# Patient Record
Sex: Male | Born: 1990 | Race: Black or African American | Hispanic: No | Marital: Single | Smoking: Current some day smoker
Health system: Southern US, Community
[De-identification: ages and names within clinical notes are randomized; demographics above are authoritative.]

---
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
## 2011-03-15 LAB — GC/CHLAMYDIA PROBE AMP, GENITAL: GC Probe Amp, Genital: POSITIVE — AB

## 2012-06-29 ENCOUNTER — Emergency Department (HOSPITAL_COMMUNITY): Payer: Self-pay

## 2012-06-29 ENCOUNTER — Emergency Department (HOSPITAL_COMMUNITY)
Admission: EM | Admit: 2012-06-29 | Discharge: 2012-06-29 | Disposition: A | Discharge status: Home / Self Care | Payer: Self-pay | Attending: Emergency Medicine | Admitting: Emergency Medicine

## 2012-06-29 ENCOUNTER — Encounter (HOSPITAL_COMMUNITY): Payer: Self-pay | Admitting: Emergency Medicine

## 2012-06-29 DIAGNOSIS — M25569 Pain in unspecified knee: Secondary | ICD-10-CM | POA: Insufficient documentation

## 2012-06-29 DIAGNOSIS — S838X9A Sprain of other specified parts of unspecified knee, initial encounter: Secondary | ICD-10-CM | POA: Insufficient documentation

## 2012-06-29 DIAGNOSIS — S86919A Strain of unspecified muscle(s) and tendon(s) at lower leg level, unspecified leg, initial encounter: Secondary | ICD-10-CM

## 2012-06-29 DIAGNOSIS — S86819A Strain of other muscle(s) and tendon(s) at lower leg level, unspecified leg, initial encounter: Secondary | ICD-10-CM | POA: Insufficient documentation

## 2012-06-29 DIAGNOSIS — M25579 Pain in unspecified ankle and joints of unspecified foot: Secondary | ICD-10-CM | POA: Insufficient documentation

## 2012-06-29 MED ORDER — ACETAMINOPHEN 325 MG PO TABS
650.0000 mg | ORAL_TABLET | Freq: Once | ORAL | Status: AC
  Administered 2012-06-29: 650 mg via ORAL
  Filled 2012-06-29: qty 2

## 2012-06-29 MED ORDER — HYDROCODONE-ACETAMINOPHEN 5-325 MG PO TABS: 2.0000 | ORAL_TABLET | ORAL | Status: AC | PRN
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
## 2012-06-29 NOTE — Progress Notes (Signed)
Orthopedic Tech Progress Note Patient Details:  Jonathan Williamson [birth year] [id number]  Ortho Devices Type of Ortho Device: Crutches;Knee Immobilizer Ortho Device/Splint Location: (R) LE Ortho Device/Splint Interventions: Application   Jennye Moccasin 06/29/2012, 6:00 PM
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
## 2013-02-11 ENCOUNTER — Encounter (HOSPITAL_COMMUNITY): Payer: Self-pay | Admitting: Emergency Medicine

## 2013-02-11 ENCOUNTER — Emergency Department (HOSPITAL_COMMUNITY): Payer: Self-pay

## 2013-02-11 ENCOUNTER — Emergency Department (HOSPITAL_COMMUNITY)
Admission: EM | Admit: 2013-02-11 | Discharge: 2013-02-11 | Disposition: A | Discharge status: Home / Self Care | Payer: Self-pay | Attending: Emergency Medicine | Admitting: Emergency Medicine

## 2013-02-11 DIAGNOSIS — M25569 Pain in unspecified knee: Secondary | ICD-10-CM | POA: Insufficient documentation

## 2013-02-11 DIAGNOSIS — F17200 Nicotine dependence, unspecified, uncomplicated: Secondary | ICD-10-CM | POA: Insufficient documentation

## 2013-02-11 DIAGNOSIS — G8929 Other chronic pain: Secondary | ICD-10-CM | POA: Insufficient documentation

## 2013-02-11 MED ORDER — HYDROCODONE-ACETAMINOPHEN 5-325 MG PO TABS: 2.0000 | ORAL_TABLET | ORAL | Status: DC | PRN

## 2013-02-11 MED ORDER — HYDROCODONE-ACETAMINOPHEN 5-325 MG PO TABS
2.0000 | ORAL_TABLET | Freq: Once | ORAL | Status: AC
  Administered 2013-02-11: 2 via ORAL
  Filled 2013-02-11: qty 2

## 2013-02-11 NOTE — ED Notes (Signed)
Pt reports increased burning and weakness in r/knee. Pt c/o increased pain and decreased mobility

## 2013-02-11 NOTE — ED Notes (Signed)
Pt has a ride home.  
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
## 2013-02-13 NOTE — ED Provider Notes (Signed)
History/physical exam/procedure(s) were performed by non-physician practitioner and as supervising physician I was immediately available for consultation/collaboration. I have reviewed all notes and am in agreement with care and plan.   Hilario Quarry, MD 02/13/13 [phone]

## 2013-03-10 ENCOUNTER — Emergency Department (HOSPITAL_COMMUNITY)
Admission: EM | Admit: 2013-03-10 | Discharge: 2013-03-10 | Disposition: A | Discharge status: Home / Self Care | Payer: Self-pay | Attending: Emergency Medicine | Admitting: Emergency Medicine

## 2013-03-10 ENCOUNTER — Encounter (HOSPITAL_COMMUNITY): Payer: Self-pay | Admitting: Emergency Medicine

## 2013-03-10 DIAGNOSIS — S6990XA Unspecified injury of unspecified wrist, hand and finger(s), initial encounter: Secondary | ICD-10-CM | POA: Insufficient documentation

## 2013-03-10 DIAGNOSIS — F17200 Nicotine dependence, unspecified, uncomplicated: Secondary | ICD-10-CM | POA: Insufficient documentation

## 2013-03-10 DIAGNOSIS — S6980XA Other specified injuries of unspecified wrist, hand and finger(s), initial encounter: Secondary | ICD-10-CM | POA: Insufficient documentation

## 2013-03-10 DIAGNOSIS — Y939 Activity, unspecified: Secondary | ICD-10-CM | POA: Insufficient documentation

## 2013-03-10 DIAGNOSIS — L02511 Cutaneous abscess of right hand: Secondary | ICD-10-CM

## 2013-03-10 DIAGNOSIS — Y929 Unspecified place or not applicable: Secondary | ICD-10-CM | POA: Insufficient documentation

## 2013-03-10 DIAGNOSIS — X58XXXA Exposure to other specified factors, initial encounter: Secondary | ICD-10-CM | POA: Insufficient documentation

## 2013-03-10 MED ORDER — SULFAMETHOXAZOLE-TRIMETHOPRIM 800-160 MG PO TABS: 1.0000 | ORAL_TABLET | Freq: Two times a day (BID) | ORAL | Status: DC

## 2013-03-10 MED ORDER — CEPHALEXIN 500 MG PO CAPS
500.0000 mg | ORAL_CAPSULE | Freq: Once | ORAL | Status: AC
  Administered 2013-03-10: 500 mg via ORAL
  Filled 2013-03-10: qty 1

## 2013-03-10 MED ORDER — HYDROCODONE-ACETAMINOPHEN 5-325 MG PO TABS: 1.0000 | ORAL_TABLET | ORAL | Status: DC | PRN

## 2013-03-10 MED ORDER — HYDROCODONE-ACETAMINOPHEN 5-325 MG PO TABS
1.0000 | ORAL_TABLET | Freq: Once | ORAL | Status: DC
  Filled 2013-03-10 (×2): qty 1

## 2013-03-10 MED ORDER — IBUPROFEN 800 MG PO TABS
800.0000 mg | ORAL_TABLET | Freq: Once | ORAL | Status: AC
  Administered 2013-03-10: 800 mg via ORAL
  Filled 2013-03-10: qty 1

## 2013-03-10 MED ORDER — CEPHALEXIN 500 MG PO CAPS: 500.0000 mg | ORAL_CAPSULE | Freq: Four times a day (QID) | ORAL | Status: DC

## 2013-03-10 NOTE — ED Provider Notes (Signed)
History     CSN: [id number]  Arrival date & time 03/10/13  7846   First MD Initiated Contact with Patient 03/10/13 [phone]      Chief Complaint  Patient presents with  . Finger Injury    (Consider location/radiation/quality/duration/timing/severity/associated sxs/prior treatment) HPI  22 year old male presents for evaluations of finger pain.  Patient reports for the past 3-4 days he has had gradual onset of pain to his right index finger. Describe pain as a sharp and throbbing sensation, worsening with palpation, persistent, not improved with applying ice. He denies any specific injury.  No fever, chills, rash.  Does not know if he had chicken pox in the past.  Have not pulled any hang nail from the affected finger.    History reviewed. No pertinent past medical history.  History reviewed. No pertinent past surgical history.  No family history on file.  History  Substance Use Topics  . Smoking status: Current Every Day Smoker -- 0.25 packs/day    Types: Cigarettes  . Smokeless tobacco: Not on file  . Alcohol Use: Yes     Comment: occasion      Review of Systems  Constitutional: Negative for fever.  Skin: Negative for rash and wound.  Neurological: Negative for numbness.    Allergies  Review of patient's allergies indicates no known allergies.  Home Medications   Current Outpatient Rx  Name  Route  Sig  Dispense  Refill  . HYDROcodone-acetaminophen (NORCO/VICODIN) 5-325 MG per tablet   Oral   Take 2 tablets by mouth every 4 (four) hours as needed for pain.   6 tablet   0     There were no vitals taken for this visit.  Physical Exam  Nursing note and vitals reviewed. Constitutional: He appears well-developed and well-nourished. No distress.  HENT:  Head: Atraumatic.  Eyes: Conjunctivae are normal.  Neck: Neck supple.  Musculoskeletal: He exhibits tenderness (R index finger: moderate edema warmth and tenderness to pad of finger, ttp, no abscess or rash  noted.  no joint involvement.  No nail involvement).  Neurological: He is alert.  Skin: Skin is warm. No rash noted.    ED Course  Procedures (including critical care time)  8:10 AM Pt has R index finger tenderness at pad of finger.  It appears to be signs of early felon.  No evidence of paronychia.  No evidence of joint involvement. Not amendable for drainage now. My attending agrees.  I have consulted with hand specialist, Dr. Izora Ribas who agrees to f/u if worsen.  Keflex and vicodin provided.    Labs Reviewed - No data to display No results found.   1. Felon, right       MDM  BP 134/82  Pulse 83  Temp(Src) 97.9 F (36.6 C) (Oral)  Resp 18  SpO2 99%         Fayrene Helper, PA-C 03/10/13 0818

## 2013-03-10 NOTE — ED Provider Notes (Signed)
Medical screening examination/treatment/procedure(s) were conducted as a shared visit with non-physician practitioner(s) and myself.  I personally evaluated the patient during the encounter  Edema and ttp finger pad.  Do not think it is extensive enough at this time to require I&D.  Will start abx.  Warm compresses.  Discussed with hand surgery and will have him follow up closely.  Celene Kras, MD 03/10/13 [phone]

## 2013-03-10 NOTE — ED Notes (Signed)
Voiced understanding of instructions given 
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
## 2013-03-10 NOTE — ED Notes (Signed)
States that he has not injured his right index finger in any way. States that it is very painful to touch on the palmar surface of his finger. States that the pain radiates down into his hand. Patient's right hand is swollen but he denies pain with palpation of his hand.

## 2013-09-12 ENCOUNTER — Emergency Department (HOSPITAL_COMMUNITY)
Admission: EM | Admit: 2013-09-12 | Discharge: 2013-09-12 | Disposition: A | Discharge status: Home / Self Care | Payer: Self-pay | Attending: Emergency Medicine | Admitting: Emergency Medicine

## 2013-09-12 ENCOUNTER — Encounter (HOSPITAL_COMMUNITY): Payer: Self-pay | Admitting: *Deleted

## 2013-09-12 DIAGNOSIS — R51 Headache: Secondary | ICD-10-CM | POA: Insufficient documentation

## 2013-09-12 DIAGNOSIS — R197 Diarrhea, unspecified: Secondary | ICD-10-CM | POA: Insufficient documentation

## 2013-09-12 DIAGNOSIS — F17200 Nicotine dependence, unspecified, uncomplicated: Secondary | ICD-10-CM | POA: Insufficient documentation

## 2013-09-12 DIAGNOSIS — J069 Acute upper respiratory infection, unspecified: Secondary | ICD-10-CM | POA: Insufficient documentation

## 2013-09-12 DIAGNOSIS — A088 Other specified intestinal infections: Secondary | ICD-10-CM | POA: Insufficient documentation

## 2013-09-12 DIAGNOSIS — A084 Viral intestinal infection, unspecified: Secondary | ICD-10-CM

## 2013-09-12 DIAGNOSIS — IMO0001 Reserved for inherently not codable concepts without codable children: Secondary | ICD-10-CM | POA: Insufficient documentation

## 2013-09-12 DIAGNOSIS — R509 Fever, unspecified: Secondary | ICD-10-CM | POA: Insufficient documentation

## 2013-09-12 MED ORDER — PROMETHAZINE HCL 25 MG PO TABS: 25.0000 mg | ORAL_TABLET | Freq: Four times a day (QID) | ORAL | Status: DC | PRN
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
## 2013-09-12 NOTE — ED Notes (Signed)
Pt c/o cold symptoms; fever since Sunday

## 2013-09-16 NOTE — ED Provider Notes (Signed)
Medical screening examination/treatment/procedure(s) were performed by non-physician practitioner and as supervising physician I was immediately available for consultation/collaboration.  Nicloe Frontera R. Pookela Sellin, MD 09/16/13 1020 

## 2014-03-24 ENCOUNTER — Emergency Department (HOSPITAL_COMMUNITY)
Admission: EM | Admit: 2014-03-24 | Discharge: 2014-03-24 | Disposition: A | Discharge status: Home / Self Care | Payer: Self-pay | Attending: Emergency Medicine | Admitting: Emergency Medicine

## 2014-03-24 ENCOUNTER — Emergency Department (HOSPITAL_COMMUNITY): Payer: Self-pay

## 2014-03-24 ENCOUNTER — Encounter (HOSPITAL_COMMUNITY): Payer: Self-pay | Admitting: Emergency Medicine

## 2014-03-24 DIAGNOSIS — Y9289 Other specified places as the place of occurrence of the external cause: Secondary | ICD-10-CM | POA: Insufficient documentation

## 2014-03-24 DIAGNOSIS — S0990XA Unspecified injury of head, initial encounter: Secondary | ICD-10-CM | POA: Insufficient documentation

## 2014-03-24 DIAGNOSIS — Y9389 Activity, other specified: Secondary | ICD-10-CM | POA: Insufficient documentation

## 2014-03-24 DIAGNOSIS — IMO0002 Reserved for concepts with insufficient information to code with codable children: Secondary | ICD-10-CM | POA: Insufficient documentation

## 2014-03-24 DIAGNOSIS — S199XXA Unspecified injury of neck, initial encounter: Secondary | ICD-10-CM

## 2014-03-24 DIAGNOSIS — F411 Generalized anxiety disorder: Secondary | ICD-10-CM | POA: Insufficient documentation

## 2014-03-24 DIAGNOSIS — F17200 Nicotine dependence, unspecified, uncomplicated: Secondary | ICD-10-CM | POA: Insufficient documentation

## 2014-03-24 DIAGNOSIS — S0993XA Unspecified injury of face, initial encounter: Secondary | ICD-10-CM | POA: Insufficient documentation

## 2014-03-24 LAB — CBC
HEMATOCRIT: 39 % (ref 39.0–52.0)
HEMOGLOBIN: 14.2 g/dL (ref 13.0–17.0)
MCH: 31.8 pg (ref 26.0–34.0)
MCHC: 36.4 g/dL — AB (ref 30.0–36.0)
MCV: 87.4 fL (ref 78.0–100.0)
Platelets: 240 10*3/uL (ref 150–400)
RBC: 4.46 MIL/uL (ref 4.22–5.81)
RDW: 12.5 % (ref 11.5–15.5)
WBC: 4.3 10*3/uL (ref 4.0–10.5)

## 2014-03-24 LAB — COMPREHENSIVE METABOLIC PANEL
ALT: 18 U/L (ref 0–53)
AST: 26 U/L (ref 0–37)
Albumin: 3.7 g/dL (ref 3.5–5.2)
Alkaline Phosphatase: 44 U/L (ref 39–117)
BUN: 12 mg/dL (ref 6–23)
CALCIUM: 8.9 mg/dL (ref 8.4–10.5)
CO2: 23 mEq/L (ref 19–32)
CREATININE: 1.15 mg/dL (ref 0.50–1.35)
Chloride: 109 mEq/L (ref 96–112)
GFR calc non Af Amer: 89 mL/min — ABNORMAL LOW (ref >90)
GLUCOSE: 78 mg/dL (ref 70–99)
Potassium: 4.1 mEq/L (ref 3.7–5.3)
SODIUM: 144 meq/L (ref 137–147)
TOTAL PROTEIN: 6.8 g/dL (ref 6.0–8.3)
Total Bilirubin: 0.4 mg/dL (ref 0.3–1.2)

## 2014-03-24 LAB — PROTIME-INR
INR: 0.99 (ref 0.00–1.49)
PROTHROMBIN TIME: 12.9 s (ref 11.6–15.2)

## 2014-03-24 LAB — ETHANOL: ALCOHOL ETHYL (B): 57 mg/dL — AB (ref 0–11)

## 2014-03-24 MED ORDER — IBUPROFEN 800 MG PO TABS
800.0000 mg | ORAL_TABLET | Freq: Once | ORAL | Status: AC
  Administered 2014-03-24: 800 mg via ORAL
  Filled 2014-03-24: qty 1

## 2014-03-24 MED ORDER — KETOROLAC TROMETHAMINE 30 MG/ML IJ SOLN
30.0000 mg | Freq: Once | INTRAMUSCULAR | Status: AC
  Administered 2014-03-24: 30 mg via INTRAVENOUS
  Filled 2014-03-24: qty 1

## 2014-03-24 MED ORDER — SODIUM CHLORIDE 0.9 % IV BOLUS (SEPSIS)
1000.0000 mL | Freq: Once | INTRAVENOUS | Status: AC
  Administered 2014-03-24: 1000 mL via INTRAVENOUS

## 2014-03-24 MED ORDER — IBUPROFEN 800 MG PO TABS: 800.0000 mg | ORAL_TABLET | Freq: Three times a day (TID) | ORAL | Status: DC

## 2014-03-24 NOTE — ED Notes (Signed)
Patient transported to CT 
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
## 2014-03-24 NOTE — ED Notes (Signed)
Pt reports ETOH on board. Reports drinking 1 beer earlier in the day.
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
## 2015-04-09 ENCOUNTER — Other Ambulatory Visit: Payer: Self-pay

## 2015-04-09 ENCOUNTER — Emergency Department (HOSPITAL_BASED_OUTPATIENT_CLINIC_OR_DEPARTMENT_OTHER): Payer: Managed Care, Other (non HMO)

## 2015-04-09 ENCOUNTER — Emergency Department (HOSPITAL_COMMUNITY)
Admission: EM | Admit: 2015-04-09 | Discharge: 2015-04-09 | Disposition: A | Discharge status: Home / Self Care | Payer: Managed Care, Other (non HMO) | Attending: Emergency Medicine | Admitting: Emergency Medicine

## 2015-04-09 ENCOUNTER — Encounter (HOSPITAL_COMMUNITY): Payer: Self-pay | Admitting: *Deleted

## 2015-04-09 ENCOUNTER — Emergency Department (HOSPITAL_COMMUNITY): Payer: Managed Care, Other (non HMO)

## 2015-04-09 DIAGNOSIS — R0789 Other chest pain: Secondary | ICD-10-CM | POA: Diagnosis not present

## 2015-04-09 DIAGNOSIS — M79609 Pain in unspecified limb: Secondary | ICD-10-CM

## 2015-04-09 DIAGNOSIS — Z720 Tobacco use: Secondary | ICD-10-CM | POA: Diagnosis not present

## 2015-04-09 DIAGNOSIS — R0602 Shortness of breath: Secondary | ICD-10-CM | POA: Insufficient documentation

## 2015-04-09 DIAGNOSIS — Z791 Long term (current) use of non-steroidal anti-inflammatories (NSAID): Secondary | ICD-10-CM | POA: Insufficient documentation

## 2015-04-09 DIAGNOSIS — M7989 Other specified soft tissue disorders: Secondary | ICD-10-CM

## 2015-04-09 DIAGNOSIS — M79604 Pain in right leg: Secondary | ICD-10-CM

## 2015-04-09 DIAGNOSIS — F419 Anxiety disorder, unspecified: Secondary | ICD-10-CM | POA: Diagnosis not present

## 2015-04-09 LAB — CBC
HCT: 41.2 % (ref 39.0–52.0)
HEMOGLOBIN: 14.6 g/dL (ref 13.0–17.0)
MCH: 30.5 pg (ref 26.0–34.0)
MCHC: 35.4 g/dL (ref 30.0–36.0)
MCV: 86 fL (ref 78.0–100.0)
PLATELETS: 271 10*3/uL (ref 150–400)
RBC: 4.79 MIL/uL (ref 4.22–5.81)
RDW: 12.4 % (ref 11.5–15.5)
WBC: 4.1 10*3/uL (ref 4.0–10.5)

## 2015-04-09 LAB — BASIC METABOLIC PANEL
Anion gap: 8 (ref 5–15)
BUN: 10 mg/dL (ref 6–20)
CO2: 24 mmol/L (ref 22–32)
Calcium: 9.6 mg/dL (ref 8.9–10.3)
Chloride: 108 mmol/L (ref 101–111)
Creatinine, Ser: 0.96 mg/dL (ref 0.61–1.24)
GFR calc Af Amer: >60 mL/min (ref >60)
GLUCOSE: 99 mg/dL (ref 70–99)
Potassium: 4.2 mmol/L (ref 3.5–5.1)
Sodium: 140 mmol/L (ref 135–145)

## 2015-04-09 LAB — I-STAT TROPONIN, ED: TROPONIN I, POC: 0 ng/mL (ref 0.00–0.08)

## 2015-04-09 MED ORDER — NAPROXEN 500 MG PO TABS: 500.0000 mg | ORAL_TABLET | Freq: Two times a day (BID) | ORAL | Status: DC

## 2015-04-09 NOTE — ED Notes (Signed)
Josh, pa-c, at the bedside.  

## 2015-04-09 NOTE — ED Notes (Signed)
Called ultrasound to verify patient is on list for travel. Spoke with [place], patient is on the list for transport. Explained plan of care to patient.

## 2015-04-09 NOTE — Progress Notes (Signed)
VASCULAR LAB PRELIMINARY  PRELIMINARY  PRELIMINARY  PRELIMINARY  Right lower extremity venous duplex completed.    Preliminary report:  Right:  No evidence of DVT, superficial thrombosis, or Baker's cyst.  Jonathan Williamson, RVS 04/09/2015, 8:47 PM

## 2015-04-09 NOTE — ED Provider Notes (Signed)
CSN: [id number]     Arrival date & time 04/09/15  1549 History  This chart was scribed for non-physician practitioner Renne Crigler, PA-C working with Jerelyn Scott, MD by Murriel Hopper, ED Scribe. This patient was seen in room TR10C/TR10C and the patient's care was started at 6:17 PM.   Chief Complaint  Patient presents with  . Leg Pain  . Shortness of Breath    The history is provided by the patient. No language interpreter was used.     HPI Comments: Jonathan Williamson is a 24 y.o. male who presents to the Emergency Department complaining of intermittent SOB with associated chest tightness and right calf pain that has been present for about a week. Pt states that he began to have SOB and chest tightness last week when he was at work carrying and moving things. Pt notes that his chest tightness worsens with movement and states that it feels muscular in nature. Pt describes his right calf pain as a burning sensation and notes that it only occurs whenever he has SOB and chest tightness. Pt denies cough, coughing up blood, recent travel, recent surgeries/immobilization. No h/o DVT/PE.    History reviewed. No pertinent past medical history. History reviewed. No pertinent past surgical history. No family history on file. History  Substance Use Topics  . Smoking status: Current Every Day Smoker -- 0.25 packs/day    Types: Cigarettes  . Smokeless tobacco: Not on file  . Alcohol Use: Yes     Comment: occasion    Review of Systems  Constitutional: Negative for fever.  HENT: Negative for rhinorrhea and sore throat.   Eyes: Negative for redness.  Respiratory: Positive for chest tightness and shortness of breath. Negative for cough.   Cardiovascular: Negative for chest pain and leg swelling.  Gastrointestinal: Negative for nausea, vomiting, abdominal pain and diarrhea.  Genitourinary: Negative for dysuria.  Musculoskeletal: Positive for myalgias.  Skin: Negative for rash.  Neurological:  Negative for headaches.      Allergies  Review of patient's allergies indicates no known allergies.  Home Medications   Prior to Admission medications   Medication Sig Start Date End Date Taking? Authorizing Provider  ibuprofen (ADVIL,MOTRIN) 800 MG tablet Take 1 tablet (800 mg total) by mouth 3 (three) times daily. Patient not taking: Reported on 04/09/2015 03/24/14   Gerhard Munch, MD   BP 153/112 mmHg  Pulse 91  Temp(Src) 98.4 F (36.9 C) (Oral)  Resp 18  SpO2 100% Physical Exam  Constitutional: He is oriented to person, place, and time. He appears well-developed and well-nourished.  HENT:  Head: Normocephalic and atraumatic.  Mouth/Throat: Oropharynx is clear and moist.  Eyes: Conjunctivae are normal. Right eye exhibits no discharge. Left eye exhibits no discharge.  Neck: Normal range of motion. Neck supple.  Cardiovascular: Normal rate, regular rhythm and normal heart sounds.   No murmur heard. Pulses:      Dorsalis pedis pulses are 2+ on the right side, and 2+ on the left side.  Pulmonary/Chest: Effort normal and breath sounds normal. No respiratory distress. He has no wheezes. He has no rales. He exhibits tenderness (anterior chest tenderness to palpation).  Abdominal: Soft. He exhibits no distension. There is no tenderness.  Musculoskeletal: He exhibits tenderness. He exhibits no edema.  Minimal R calf tenderness. No appreciable swelling. No thigh tenderness. L LE nml.   Neurological: He is alert and oriented to person, place, and time.  Skin: Skin is warm and dry.  Psychiatric: His mood appears  anxious.  Nursing note and vitals reviewed.   ED Course  Procedures (including critical care time)  DIAGNOSTIC STUDIES: Oxygen Saturation is 100% on room air, normal by my interpretation.    COORDINATION OF CARE: 6:20 PM Discussed treatment plan with pt at bedside and pt agreed to plan.   Labs Review Labs Reviewed  CBC  BASIC METABOLIC PANEL  Rosezena SensorI-STAT TROPOININ, ED     Imaging Review Dg Chest 2 View  04/09/2015   CLINICAL DATA:  Leg pain.  Shortness of breath.  Calf pain.  EXAM: CHEST  2 VIEW  COMPARISON:  03/24/2014.  FINDINGS: Cardiopericardial silhouette within normal limits. Mediastinal contours normal. Trachea midline. No airspace disease or effusion.  IMPRESSION: No active cardiopulmonary disease.   Electronically Signed   By: Andreas NewportGeoffrey  Lamke M.D.   On: 04/09/2015 18:31     EKG Interpretation None      9:19 PM lower extremity Doppler performed and is negative for DVT. Reviewed all other results with patient and family at bedside.  Patient to use NSAIDs for lower extremity pain and chest wall tenderness. Patient encouraged to follow-up with PCP for further evaluation of symptoms if not improved in one week. Patient encouraged to return to the emergency department with worsening or changing chest pain, shortness of breath, difficulty breathing, or other concerns.  Patient was counseled to return with severe chest pain, especially if the pain is crushing or pressure-like and spreads to the arms, back, neck, or jaw, or if they have sweating, nausea, or shortness of breath with the pain. They were encouraged to call 911 with these symptoms.   They were also told to return if their chest pain gets worse and does not go away with rest, they have an attack of chest pain lasting longer than usual despite rest and treatment with the medications their caregiver has prescribed, if they wake from sleep with chest pain or shortness of breath, if they feel dizzy or faint, if they have chest pain not typical of their usual pain, or if they have any other emergent concerns regarding their health.  The patient verbalized understanding and agreed.    MDM   Final diagnoses:  Pain of right lower extremity  Musculoskeletal chest pain   Patient with right lower extremity burning pain as well as reproducible chest pain and subjective shortness of breath. Of note,  patient is in no respiratory distress at time of exam. Shortness of breath sensation seems to be most related to his chest wall pain. Cardiac workup in the ED including chest x-ray was negative. Doppler ultrasound was performed of right lower extremity and does not demonstrate any DVT. Right lower extremity does not demonstrate any cellulitis. Normal lower extremity pulses. I have very low suspicion for PE. Patient was tachycardic on initial EKG but otherwise has not been tachycardic since then, and has no other major risk factors for thromboembolism. No hypoxia.  Very low suspicion for ACS, pneumonia, other emergent causes of chest pain.   I personally performed the services described in this documentation, which was scribed in my presence. The recorded information has been reviewed and is accurate.    Renne CriglerJoshua Eadie Repetto, PA-C 04/09/15 2122  Jerelyn ScottMartha Linker, MD 04/09/15 2200

## 2015-04-09 NOTE — ED Notes (Signed)
Pt is here with right calf muscle burning for one week.  Pulse present.  PT states burning started in thighs and travelled to calf and is reporting sob over the last week.

## 2015-04-09 NOTE — Discharge Instructions (Signed)
Please read and follow all provided instructions.  Your diagnoses today include:  1. Pain of right lower extremity   2. Musculoskeletal chest pain    Tests performed today include:  An EKG of your heart  A chest x-ray  Cardiac enzymes - a blood test for heart muscle damage  Blood counts and electrolytes  Vital signs. See below for your results today.   Medications prescribed:   Naproxen - anti-inflammatory pain medication  Do not exceed 500mg  naproxen every 12 hours, take with food  You have been prescribed an anti-inflammatory medication or NSAID. Take with food. Take smallest effective dose for the shortest duration needed for your pain. Stop taking if you experience stomach pain or vomiting.   Take any prescribed medications only as directed.  Follow-up instructions: Please follow-up with your primary care provider as soon as you can for further evaluation of your symptoms.   Return instructions:  SEEK IMMEDIATE MEDICAL ATTENTION IF:  You have severe chest pain, especially if the pain is crushing or pressure-like and spreads to the arms, back, neck, or jaw, or if you have sweating, nausea (feeling sick to your stomach), or shortness of breath. THIS IS AN EMERGENCY. Don't wait to see if the pain will go away. Get medical help at once. Call 911 or 0 (operator). DO NOT drive yourself to the hospital.   Your chest pain gets worse and does not go away with rest.   You have an attack of chest pain lasting longer than usual, despite rest and treatment with the medications your caregiver has prescribed.   You wake from sleep with chest pain or shortness of breath.  You feel dizzy or faint.  You have chest pain not typical of your usual pain for which you originally saw your caregiver.   You have any other emergent concerns regarding your health.  Additional Information: Chest pain comes from many different causes. Your caregiver has diagnosed you as having chest pain that  is not specific for one problem, but does not require admission.  You are at low risk for an acute heart condition or other serious illness.   Your vital signs today were: BP 125/80 mmHg   Pulse 73   Temp(Src) 98.1 F (36.7 C) (Oral)   Resp 18   SpO2 100% If your blood pressure (BP) was elevated above 135/85 this visit, please have this repeated by your doctor within one month. --------------

## 2015-06-18 ENCOUNTER — Encounter (HOSPITAL_COMMUNITY): Payer: Self-pay

## 2015-06-18 ENCOUNTER — Emergency Department (HOSPITAL_COMMUNITY): Payer: Managed Care, Other (non HMO)

## 2015-06-18 ENCOUNTER — Emergency Department (HOSPITAL_COMMUNITY)
Admission: EM | Admit: 2015-06-18 | Discharge: 2015-06-18 | Disposition: A | Discharge status: Home / Self Care | Payer: Managed Care, Other (non HMO) | Attending: Emergency Medicine | Admitting: Emergency Medicine

## 2015-06-18 DIAGNOSIS — Z720 Tobacco use: Secondary | ICD-10-CM | POA: Insufficient documentation

## 2015-06-18 DIAGNOSIS — Z791 Long term (current) use of non-steroidal anti-inflammatories (NSAID): Secondary | ICD-10-CM | POA: Diagnosis not present

## 2015-06-18 DIAGNOSIS — M25552 Pain in left hip: Secondary | ICD-10-CM | POA: Insufficient documentation

## 2015-06-18 MED ORDER — TRAMADOL HCL 50 MG PO TABS: 50.0000 mg | ORAL_TABLET | Freq: Four times a day (QID) | ORAL | Status: DC | PRN

## 2015-06-18 NOTE — ED Notes (Signed)
Pt escorted to discharge window. Verbalized understanding discharge instructions. In no acute distress.   

## 2015-06-18 NOTE — ED Provider Notes (Signed)
CSN: [id number]     Arrival date & time 06/18/15  1134 History   First MD Initiated Contact with Patient 06/18/15 1151     Chief Complaint  Patient presents with  . Hip Pain     (Consider location/radiation/quality/duration/timing/severity/associated sxs/prior Treatment) HPI Comments: States that he does a lot of lifting at work and then also works out. No definite injury  Patient is a 24 y.o. male presenting with hip pain. The history is provided by the patient. No language interpreter was used.  Hip Pain This is a new problem. The current episode started 1 to 4 weeks ago. The problem occurs constantly. The problem has been gradually worsening. Pertinent negatives include no fever or joint swelling. The symptoms are aggravated by walking. He has tried acetaminophen and NSAIDs for the symptoms. The treatment provided mild relief.    History reviewed. No pertinent past medical history. History reviewed. No pertinent past surgical history. No family history on file. History  Substance Use Topics  . Smoking status: Current Every Day Smoker -- 0.25 packs/day    Types: Cigarettes  . Smokeless tobacco: Not on file  . Alcohol Use: Yes     Comment: occasion    Review of Systems  Constitutional: Negative for fever.  Musculoskeletal: Negative for joint swelling.  All other systems reviewed and are negative.     Allergies  Review of patient's allergies indicates no known allergies.  Home Medications   Prior to Admission medications   Medication Sig Start Date End Date Taking? Authorizing Provider  naproxen (NAPROSYN) 500 MG tablet Take 1 tablet (500 mg total) by mouth 2 (two) times daily with a meal. 04/09/15   Renne CriglerJoshua Geiple, PA-C   BP 164/94 mmHg  Pulse 84  Temp(Src) 98.1 F (36.7 C) (Oral)  Resp 16  SpO2 100% Physical Exam  Constitutional: He is oriented to person, place, and time.  Cardiovascular: Normal rate and regular rhythm.   Pulmonary/Chest: Effort normal and breath  sounds normal.  Musculoskeletal: Normal range of motion.  Tender on the lateral left hip. No shortening or rotation. No redness or warmth noted  Neurological: He is alert and oriented to person, place, and time. Coordination normal.  Skin: Skin is warm.  Nursing note and vitals reviewed.   ED Course  Procedures (including critical care time) Labs Review Labs Reviewed - No data to display  Imaging Review Dg Hip Unilat With Pelvis 2-3 Views Left  06/18/2015   CLINICAL DATA:  Left hip pain for 3 weeks, no known injury, initial encounter  EXAM: DG HIP (WITH OR WITHOUT PELVIS) 2-3V LEFT  COMPARISON:  None.  FINDINGS: There is no evidence of hip fracture or dislocation. There is no evidence of arthropathy or other focal bone abnormality.  IMPRESSION: No acute abnormality noted.   Electronically Signed   By: Alcide CleverMark  Lukens M.D.   On: 06/18/2015 12:59     EKG Interpretation None      MDM   Final diagnoses:  Hip pain, left    Pt is neurologically intact. No acute bony injury noted. Pt is okay to follow up with ortho as needed. Pt given ultram for pain    Teressa LowerVrinda Berdell Nevitt, NP 06/18/15 1308  Raeford RazorStephen Kohut, MD 06/20/15 0930

## 2015-06-18 NOTE — Discharge Instructions (Signed)

## 2015-06-18 NOTE — ED Notes (Signed)
Pt presents with c/o left hip pain for approx 3 weeks. Pt is unsure of whether he injured the hip. Pt ambulatory to triage.

## 2015-06-18 NOTE — ED Notes (Signed)
Pt given a work note.  

## 2015-08-21 ENCOUNTER — Emergency Department (HOSPITAL_COMMUNITY)
Admission: EM | Admit: 2015-08-21 | Discharge: 2015-08-21 | Disposition: A | Discharge status: Home / Self Care | Payer: Managed Care, Other (non HMO) | Attending: Emergency Medicine | Admitting: Emergency Medicine

## 2015-08-21 ENCOUNTER — Encounter (HOSPITAL_COMMUNITY): Payer: Self-pay | Admitting: Emergency Medicine

## 2015-08-21 DIAGNOSIS — Z720 Tobacco use: Secondary | ICD-10-CM | POA: Insufficient documentation

## 2015-08-21 DIAGNOSIS — R1030 Lower abdominal pain, unspecified: Secondary | ICD-10-CM | POA: Diagnosis present

## 2015-08-21 DIAGNOSIS — B356 Tinea cruris: Secondary | ICD-10-CM

## 2015-08-21 MED ORDER — CLOTRIMAZOLE 1 % EX CREA: TOPICAL_CREAM | CUTANEOUS | Status: DC

## 2015-08-21 NOTE — ED Notes (Signed)
Awake. Verbally responsive. A/O x4. Resp even and unlabored. No audible adventitious breath sounds noted. ABC's intact.  

## 2015-08-21 NOTE — Discharge Instructions (Signed)
Jock Itch Jock itch is a fungal infection of the skin in the groin area. It is sometimes called "ringworm" even though it is not caused by a worm. A fungus is a type of germ that thrives in dark, damp places.  CAUSES  This infection may spread from:  A fungus infection elsewhere on the body (such as athlete's foot).  Sharing towels or clothing. This infection is more common in:  Hot, humid climates.  People who wear tight-fitting clothing or wet bathing suits for long periods of time.  Athletes.  Overweight people.  People with diabetes. SYMPTOMS  Jock itch causes the following symptoms:  Red, pink or brown rash in the groin. Rash may spread to the thighs, anus, and buttocks.  Itching. DIAGNOSIS  Your caregiver may make the diagnosis by looking at the rash. Sometimes a skin scraping will be sent to test for fungus. Testing can be done either by looking under the microscope or by doing a culture (test to try to grow the fungus). A culture can take up to 2 weeks to come back. TREATMENT  Jock itch may be treated with:  Skin cream or ointment to kill fungus.  Medicine by mouth to kill fungus.  Skin cream or ointment to calm the itching.  Compresses or medicated powders to dry the infected skin. HOME CARE INSTRUCTIONS   Be sure to treat the rash completely. Follow your caregiver's instructions. It can take a couple of weeks to treat. If you do not treat the infection long enough, the rash can come back.  Wear loose-fitting clothing.  Men should wear cotton boxer shorts.  Women should wear cotton underwear.  Avoid hot baths.  Dry the groin area well after bathing. SEEK MEDICAL CARE IF:   Your rash is worse.  Your rash is spreading.  Your rash returns after treatment is finished.  Your rash is not gone in 4 weeks. Fungal infections are slow to respond to treatment. Some redness may remain for several weeks after the fungus is gone. SEEK IMMEDIATE MEDICAL CARE  IF:  The area becomes red, warm, tender, and swollen.  You have a fever. Document Released: 11/14/2002 Document Revised: 02/16/2012 Document Reviewed: 10/13/2008 Exit[employer] Patient Information 2015 [employer]. This information is not intended to replace advice given to you by your health care provider. Make sure you discuss any questions you have with your health care provider.  

## 2015-08-21 NOTE — ED Provider Notes (Signed)
History  This chart was scribed for non-physician practitioner, Will Marijo File, PA-C,working with Eber Hong, MD, by Karle Plumber, ED Scribe. This patient was seen in room WTR9/WTR9 and the patient's care was started at 3:14 PM.  Chief Complaint  Patient presents with  . Groin Pain   The history is provided by the patient and medical records. No language interpreter was used.    HPI Comments:  Jonathan Williamson is a 24 y.o. male who presents to the Emergency Department complaining of itching on bilateral inner thighs for the past week. He states there is no rash but the area appears white. He states he has been applying GoldBond with no relief of the symptoms. He denies modifying factors. He denies nausea, vomiting, fever, chills, dysuria, testicle pain, scrotal pain, testicle swelling, penile swelling, penile discharge, abdominal pain, bowel or bladder changes, rash or abdominal pain.  History reviewed. No pertinent past medical history. History reviewed. No pertinent past surgical history. No family history on file. Social History  Substance Use Topics  . Smoking status: Current Every Day Smoker -- 0.25 packs/day    Types: Cigarettes  . Smokeless tobacco: None  . Alcohol Use: Yes     Comment: occasion    Review of Systems  Constitutional: Negative for fever and chills.  Gastrointestinal: Negative for nausea, vomiting and abdominal pain.  Genitourinary: Negative for dysuria, urgency, frequency, hematuria, discharge, penile swelling, scrotal swelling, difficulty urinating, genital sores, penile pain and testicular pain.       Groin itching  Skin: Negative for color change and wound.    Allergies  Review of patient's allergies indicates no known allergies.  Home Medications   Prior to Admission medications   Medication Sig Start Date End Date Taking? Authorizing Provider  Aspirin-Acetaminophen-Caffeine (GOODY HEADACHE PO) Take 1 each by mouth every 12 (twelve) hours as needed  (pain).   Yes Historical Provider, MD  clotrimazole (LOTRIMIN) 1 % cream Apply to affected area 2 times daily 08/21/15   Everlene Farrier, PA-C  naproxen (NAPROSYN) 500 MG tablet Take 1 tablet (500 mg total) by mouth 2 (two) times daily with a meal. Patient not taking: Reported on 08/21/2015 04/09/15   Renne Crigler, PA-C  traMADol (ULTRAM) 50 MG tablet Take 1 tablet (50 mg total) by mouth every 6 (six) hours as needed. Patient not taking: Reported on 08/21/2015 06/18/15   Teressa Lower, NP   Triage Vitals: BP 146/81 mmHg  Pulse 105  Temp(Src) 98.2 F (36.8 C) (Oral)  Resp 18  SpO2 100% Physical Exam  Constitutional: He appears well-developed and well-nourished. No distress.  HENT:  Head: Normocephalic and atraumatic.  Eyes: Right eye exhibits no discharge. Left eye exhibits no discharge.  Pulmonary/Chest: Effort normal. No respiratory distress.  Abdominal: Soft. There is no tenderness.  Genitourinary: Testes normal and penis normal. Right testis shows no mass, no swelling and no tenderness. Left testis shows no mass, no swelling and no tenderness. No penile tenderness.  Circular, erythematous patches with a distinct border to bilateral groin. Non-tender.  No vesicular lesions. No penile tenderness or discharge. No scrotal tenderness. No scrotal swelling.  Neurological: He is alert. Coordination normal.  Skin: Skin is warm and dry. He is not diaphoretic. There is erythema. No pallor.  Circular, erythematous patches with a distinct border to bilateral groin.  Psychiatric: He has a normal mood and affect. His behavior is normal.  Nursing note and vitals reviewed.   ED Course  Procedures (including critical care time) DIAGNOSTIC STUDIES: Oxygen  Saturation is 100% on RA, normal by my interpretation.   COORDINATION OF CARE: 3:19 PM- Will prescribe Lotrimin cream. Pt verbalizes understanding and agrees to plan.  Medications - No data to display   MDM   Meds given in ED:  Medications  - No data to display  New Prescriptions   CLOTRIMAZOLE (LOTRIMIN) 1 % CREAM    Apply to affected area 2 times daily    Final diagnoses:  Tinea cruris   Patient with tinea cruris to his bilateral inner groin. Will discharge with clotrimazole1% cream. Education on treatment of jock itch. I advised the patient to follow-up with their primary care provider this week. I advised the patient to return to the emergency department with new or worsening symptoms or new concerns. The patient verbalized understanding and agreement with plan.    I personally performed the services described in this documentation, which was scribed in my presence. The recorded information has been reviewed and is accurate.      Everlene Farrier, PA-C 08/21/15 1524  Eber Hong, MD 08/23/15 2159

## 2015-08-21 NOTE — ED Notes (Signed)
Pt reported itching in groin area. Pt denies penile discharge and burning.

## 2015-08-21 NOTE — ED Notes (Signed)
Pt c/o left groin "itching" x5 days. Denies urinary symptoms. Unrelieved by goldbond.

## 2015-08-28 ENCOUNTER — Emergency Department (HOSPITAL_COMMUNITY)
Admission: EM | Admit: 2015-08-28 | Discharge: 2015-08-28 | Disposition: A | Discharge status: Home / Self Care | Payer: Managed Care, Other (non HMO) | Attending: Emergency Medicine | Admitting: Emergency Medicine

## 2015-08-28 ENCOUNTER — Encounter (HOSPITAL_COMMUNITY): Payer: Self-pay

## 2015-08-28 ENCOUNTER — Emergency Department (HOSPITAL_COMMUNITY): Payer: Managed Care, Other (non HMO)

## 2015-08-28 DIAGNOSIS — Z566 Other physical and mental strain related to work: Secondary | ICD-10-CM | POA: Diagnosis not present

## 2015-08-28 DIAGNOSIS — R0789 Other chest pain: Secondary | ICD-10-CM | POA: Diagnosis not present

## 2015-08-28 DIAGNOSIS — Z720 Tobacco use: Secondary | ICD-10-CM | POA: Insufficient documentation

## 2015-08-28 DIAGNOSIS — R079 Chest pain, unspecified: Secondary | ICD-10-CM | POA: Diagnosis present

## 2015-08-28 DIAGNOSIS — F419 Anxiety disorder, unspecified: Secondary | ICD-10-CM | POA: Diagnosis not present

## 2015-08-28 DIAGNOSIS — R630 Anorexia: Secondary | ICD-10-CM | POA: Diagnosis not present

## 2015-08-28 DIAGNOSIS — R0602 Shortness of breath: Secondary | ICD-10-CM | POA: Diagnosis not present

## 2015-08-28 DIAGNOSIS — R350 Frequency of micturition: Secondary | ICD-10-CM | POA: Insufficient documentation

## 2015-08-28 LAB — CBC
HEMATOCRIT: 43.4 % (ref 39.0–52.0)
Hemoglobin: 15.8 g/dL (ref 13.0–17.0)
MCH: 31.8 pg (ref 26.0–34.0)
MCHC: 36.4 g/dL — ABNORMAL HIGH (ref 30.0–36.0)
MCV: 87.3 fL (ref 78.0–100.0)
PLATELETS: 267 10*3/uL (ref 150–400)
RBC: 4.97 MIL/uL (ref 4.22–5.81)
RDW: 12.7 % (ref 11.5–15.5)
WBC: 4.2 10*3/uL (ref 4.0–10.5)

## 2015-08-28 LAB — URINALYSIS, ROUTINE W REFLEX MICROSCOPIC
Bilirubin Urine: NEGATIVE
Glucose, UA: NEGATIVE mg/dL
Hgb urine dipstick: NEGATIVE
Ketones, ur: NEGATIVE mg/dL
Leukocytes, UA: NEGATIVE
Nitrite: NEGATIVE
Protein, ur: NEGATIVE mg/dL
Specific Gravity, Urine: 1.018 (ref 1.005–1.030)
Urobilinogen, UA: 1 mg/dL (ref 0.0–1.0)
pH: 6.5 (ref 5.0–8.0)

## 2015-08-28 LAB — I-STAT TROPONIN, ED: TROPONIN I, POC: 0.01 ng/mL (ref 0.00–0.08)

## 2015-08-28 LAB — BASIC METABOLIC PANEL
Anion gap: 9 (ref 5–15)
BUN: 10 mg/dL (ref 6–20)
CHLORIDE: 104 mmol/L (ref 101–111)
CO2: 25 mmol/L (ref 22–32)
CREATININE: 1.08 mg/dL (ref 0.61–1.24)
Calcium: 9.9 mg/dL (ref 8.9–10.3)
GFR calc Af Amer: >60 mL/min (ref >60)
GFR calc non Af Amer: >60 mL/min (ref >60)
Glucose, Bld: 108 mg/dL — ABNORMAL HIGH (ref 65–99)
POTASSIUM: 4.4 mmol/L (ref 3.5–5.1)
Sodium: 138 mmol/L (ref 135–145)

## 2015-08-28 MED ORDER — LORAZEPAM 1 MG PO TABS
1.0000 mg | ORAL_TABLET | Freq: Once | ORAL | Status: AC
  Administered 2015-08-28: 1 mg via ORAL
  Filled 2015-08-28: qty 1

## 2015-08-28 NOTE — Progress Notes (Signed)
[facility] ED CM noted pt with coverage but no pcp listed [facility] ED CM spoke with pt on how to obtain an in network pcp with insurance coverage via the customer service number or web site  Cm reviewed ED level of care for crisis/emergent services and community pcp level of care to manage continuous or chronic medical concerns.  The pt voiced understanding CM encouraged pt and discussed pt's responsibility to verify with pt's insurance carrier that any recommended medical provider offered by any emergency room or a hospital provider is within the carrier's network. The pt voiced understanding   

## 2015-08-28 NOTE — ED Notes (Addendum)
Pt c/o intermittent central chest pain, SOB w/ pain, lack of appetite, and urinary frequency x 4 days.  Pain score 5/10.  Pt reports chest hurts w/ "turning my head and when I work."  Denies injury.  Sts he woke up w/ pain.

## 2015-08-28 NOTE — ED Provider Notes (Signed)
CSN: [id number]     Arrival date & time 08/28/15  1105 History   First MD Initiated Contact with Patient 08/28/15 1503     Chief Complaint  Patient presents with  . Chest Pain  . Urinary Frequency     (Consider location/radiation/quality/duration/timing/severity/associated sxs/prior Treatment) HPI Comments: 24 year old male with no significant past medical history presenting with intermittent central chest pain 4 days. 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 The pain is not as bad at home as it is at work. Admits to associated shortness of breath when the pain begins. Has not tried any alleviating factors. No fever, chills, wheezing, cough, nausea, vomiting or abdominal pain. Also reporting urinary frequency over the past 4 days. Denies dysuria, hematuria, urgency, dry mouth, polydipsia, polyphagia, penile pain, swelling or discharge.  Patient is a 24 y.o. male presenting with chest pain and frequency. The history is provided by the patient.  Chest Pain Chest pain location: midsternal. Pain quality: aching and sharp   Pain radiates to:  Does not radiate Pain radiates to the back: no   Pain severity:  Moderate Duration:  4 days Timing:  Intermittent Progression:  Unchanged Chronicity:  New Context: lifting   Relieved by:  None tried Exacerbated by: lifting. Ineffective treatments:  None tried Associated symptoms: shortness of breath   Associated symptoms: no abdominal pain, no fever, no nausea and not vomiting   Urinary Frequency Associated symptoms include chest pain. Pertinent negatives include no abdominal pain, chills, fever, nausea or vomiting.    History reviewed. No pertinent past medical history. History reviewed. No pertinent past surgical history. History reviewed. No pertinent family history. Social History  Substance Use Topics  .  Smoking status: Current Every Day Smoker  . Smokeless tobacco: None     Comment: pt vvapes  . Alcohol Use: Yes     Comment: occasion    Review of Systems  Constitutional: Positive for appetite change. Negative for fever and chills.  Respiratory: Positive for shortness of breath.   Cardiovascular: Positive for chest pain.  Gastrointestinal: Negative for nausea, vomiting and abdominal pain.  Genitourinary: Positive for frequency. Negative for dysuria, urgency, discharge, penile swelling and penile pain.  Psychiatric/Behavioral: The patient is nervous/anxious.        + Stress.  All other systems reviewed and are negative.     Allergies  Review of patient's allergies indicates no known allergies.  Home Medications   Prior to Admission medications   Medication Sig Start Date End Date Taking? Authorizing Provider  Aspirin-Acetaminophen-Caffeine (GOODY HEADACHE PO) Take 1-2 each by mouth every 8 (eight) hours as needed (for pain).    Yes Historical Provider, MD  ibuprofen (ADVIL,MOTRIN) 200 MG tablet Take 400 mg by mouth every 6 (six) hours as needed for fever, headache, mild pain, moderate pain or cramping.   Yes Historical Provider, MD  clotrimazole (LOTRIMIN) 1 % cream Apply to affected area 2 times daily Patient not taking: Reported on 08/28/2015 08/21/15   Everlene Farrier, PA-C  naproxen (NAPROSYN) 500 MG tablet Take 1 tablet (500 mg total) by mouth 2 (two) times daily with a meal. Patient not taking: Reported on 08/21/2015 04/09/15   Renne Crigler, PA-C  traMADol (ULTRAM) 50 MG tablet Take 1 tablet (50 mg total) by mouth every 6 (six) hours as needed. Patient not taking: Reported on 08/21/2015 06/18/15  Teressa Lower, NP   BP 131/88 mmHg  Pulse 93  Temp(Src) 98 F (36.7 C) (Oral)  Resp 15  Ht  (1.753 m)  Wt 165 lb (74.844 kg)  BMI 24.36 kg/m2  SpO2 97% Physical Exam  Constitutional: He is oriented to person, place, and time. He appears well-developed and well-nourished.  No distress.  HENT:  Head: Normocephalic and atraumatic.  Mouth/Throat: Oropharynx is clear and moist.  Eyes: Conjunctivae and EOM are normal. Pupils are equal, round, and reactive to light.  Neck: Normal range of motion. Neck supple. No JVD present.  Cardiovascular: Normal rate, regular rhythm, normal heart sounds and intact distal pulses.   No extremity edema.  Pulmonary/Chest: Effort normal and breath sounds normal. No respiratory distress. He exhibits no tenderness.  Abdominal: Soft. Bowel sounds are normal. There is no tenderness.  Genitourinary:  Deferred per pt.  Musculoskeletal: Normal range of motion. He exhibits no edema.  Neurological: He is alert and oriented to person, place, and time. He has normal strength. No sensory deficit.  Speech fluent, goal oriented. Moves extremities without ataxia. Equal grip strength bilateral.  Skin: Skin is warm and dry. He is not diaphoretic.  Psychiatric: He has a normal mood and affect. His behavior is normal.  Nursing note and vitals reviewed.   ED Course  Procedures (including critical care time) Labs Review Labs Reviewed  BASIC METABOLIC PANEL - Abnormal; Notable for the following:    Glucose, Bld 108 (*)    All other components within normal limits  CBC - Abnormal; Notable for the following:    MCHC 36.4 (*)    All other components within normal limits  URINE CULTURE  URINALYSIS, ROUTINE W REFLEX MICROSCOPIC (NOT AT [facility])  I-STAT TROPOININ, ED  GC/CHLAMYDIA PROBE AMP ([place]) NOT AT [facility]    Imaging Review Dg Chest 2 View  08/28/2015   CLINICAL DATA:  Mid chest pain for 4 days.  Shortness of breath.  EXAM: CHEST  2 VIEW  COMPARISON:  04/09/2015  FINDINGS: The heart size and mediastinal contours are within normal limits. Both lungs are clear. The visualized skeletal structures are unremarkable.  IMPRESSION: No active cardiopulmonary disease.   Electronically Signed   By: Charlett Nose M.D.   On: 08/28/2015 13:01   I have  personally reviewed and evaluated these images and lab results as part of my medical decision-making.   EKG Interpretation None      MDM   Final diagnoses:  Midsternal chest pain  Stress at work  Urinary frequency   Non-toxic appearing, NAD. AFVSS. Labs without acute finding. UA negative. Culture pending. Urine GC/chlamydia culture pending. Patient refuses GU exam. No explanation for urinary frequency. Blood glucose 108. Chest x-ray negative. Patient is under a lot of stress, stating mostly at work. He was given some relief after receiving Ativan. Doubt cardiac. HEART score 2. Doubt PE. PERC negative. Advised the patient to stop smoking. Resources given for PCP follow-up. Discussed stress management. Stable for discharge. Return precautions given. Patient states understanding of treatment care plan and is agreeable.  Kathrynn Speed, PA-C 08/28/15 1635  Richardean Canal, MD 08/28/15 [phone]

## 2015-08-28 NOTE — Discharge Instructions (Signed)
Chest Pain (Nonspecific) It is often hard to give a specific diagnosis for the cause of chest pain. There is always a chance that your pain could be related to something serious, such as a heart attack or a blood clot in the lungs. You need to follow up with your health care provider for further evaluation. CAUSES   Heartburn.  Pneumonia or bronchitis.  Anxiety or stress.  Inflammation around your heart (pericarditis) or lung (pleuritis or pleurisy).  A blood clot in the lung.  A collapsed lung (pneumothorax). It can develop suddenly on its own (spontaneous pneumothorax) or from trauma to the chest.  Shingles infection (herpes zoster virus). The chest wall is composed of bones, muscles, and cartilage. Any of these can be the source of the pain.  The bones can be bruised by injury.  The muscles or cartilage can be strained by coughing or overwork.  The cartilage can be affected by inflammation and become sore (costochondritis). DIAGNOSIS  Lab tests or other studies may be needed to find the cause of your pain. Your health care provider may have you take a test called an ambulatory electrocardiogram (ECG). An ECG records your heartbeat patterns over a 24-hour period. You may also have other tests, such as:  Transthoracic echocardiogram (TTE). During echocardiography, sound waves are used to evaluate how blood flows through your heart.  Transesophageal echocardiogram (TEE).  Cardiac monitoring. This allows your health care provider to monitor your heart rate and rhythm in real time.  Holter monitor. This is a portable device that records your heartbeat and can help diagnose heart arrhythmias. It allows your health care provider to track your heart activity for several days, if needed.  Stress tests by exercise or by giving medicine that makes the heart beat faster. TREATMENT   Treatment depends on what may be causing your chest pain. Treatment may include:  Acid blockers for  heartburn.  Anti-inflammatory medicine.  Pain medicine for inflammatory conditions.  Antibiotics if an infection is present.  You may be advised to change lifestyle habits. This includes stopping smoking and avoiding alcohol, caffeine, and chocolate.  You may be advised to keep your head raised (elevated) when sleeping. This reduces the chance of acid going backward from your stomach into your esophagus. Most of the time, nonspecific chest pain will improve within 2-3 days with rest and mild pain medicine.  HOME CARE INSTRUCTIONS   If antibiotics were prescribed, take them as directed. Finish them even if you start to feel better.  For the next few days, avoid physical activities that bring on chest pain. Continue physical activities as directed.  Do not use any tobacco products, including cigarettes, chewing tobacco, or electronic cigarettes.  Avoid drinking alcohol.  Only take medicine as directed by your health care provider.  Follow your health care provider's suggestions for further testing if your chest pain does not go away.  Keep any follow-up appointments you made. If you do not go to an appointment, you could develop lasting (chronic) problems with pain. If there is any problem keeping an appointment, call to reschedule. SEEK MEDICAL CARE IF:   Your chest pain does not go away, even after treatment.  You have a rash with blisters on your chest.  You have a fever. SEEK IMMEDIATE MEDICAL CARE IF:   You have increased chest pain or pain that spreads to your arm, neck, jaw, back, or abdomen.  You have shortness of breath.  You have an increasing cough, or you cough  up blood.  You have severe back or abdominal pain.  You feel nauseous or vomit.  You have severe weakness.  You faint.  You have chills. This is an emergency. Do not wait to see if the pain will go away. Get medical help at once. Call your local emergency services (911 in [place] Do not drive  yourself to the hospital. MAKE SURE YOU:   Understand these instructions.  Will watch your condition.  Will get help right away if you are not doing well or get worse. Document Released: 09/03/2005 Document Revised: 11/29/2013 Document Reviewed: 06/29/2008 [facility] Patient Information 2015 [place], [place]. This information is not intended to replace advice given to you by your health care provider. Make sure you discuss any questions you have with your health care provider.  Stress and Stress Management Stress is a normal reaction to life events. It is what you feel when life demands more than you are used to or more than you can handle. Some stress can be useful. For example, the stress reaction can help you catch the last bus of the day, study for a test, or meet a deadline at work. But stress that occurs too often or for too long can cause problems. It can affect your emotional health and interfere with relationships and normal daily activities. Too much stress can weaken your immune system and increase your risk for physical illness. If you already have a medical problem, stress can make it worse. CAUSES  All sorts of life events may cause stress. An event that causes stress for one person may not be stressful for another person. Major life events commonly cause stress. These may be positive or negative. Examples include losing your job, moving into a new home, getting married, having a baby, or losing a loved one. Less obvious life events may also cause stress, especially if they occur day after day or in combination. Examples include working long hours, driving in traffic, caring for children, being in debt, or being in a difficult relationship. SIGNS AND SYMPTOMS Stress may cause emotional symptoms including, the following:  Anxiety. This is feeling worried, afraid, on edge, overwhelmed, or out of control.  Anger. This is feeling irritated or impatient.  Depression. This is feeling sad,  down, helpless, or guilty.  Difficulty focusing, remembering, or making decisions. Stress may cause physical symptoms, including the following:   Aches and pains. These may affect your head, neck, back, stomach, or other areas of your body.  Tight muscles or clenched jaw.  Low energy or trouble sleeping. Stress may cause unhealthy behaviors, including the following:   Eating to feel better (overeating) or skipping meals.  Sleeping too little, too much, or both.  Working too much or putting off tasks (procrastination).  Smoking, drinking alcohol, or using drugs to feel better. DIAGNOSIS  Stress is diagnosed through an assessment by your health care provider. Your health care provider will ask questions about your symptoms and any stressful life events.Your health care provider will also ask about your medical history and may order blood tests or other tests. Certain medical conditions and medicine can cause physical symptoms similar to stress. Mental illness can cause emotional symptoms and unhealthy behaviors similar to stress. Your health care provider may refer you to a mental health professional for further evaluation.  TREATMENT  Stress management is the recommended treatment for stress.The goals of stress management are reducing stressful life events and coping with stress in healthy ways.  Techniques for reducing stressful  life events include the following:  Stress identification. Self-monitor for stress and identify what causes stress for you. These skills may help you to avoid some stressful events.  Time management. Set your priorities, keep a calendar of events, and learn to say "no." These tools can help you avoid making too many commitments. Techniques for coping with stress include the following:  Rethinking the problem. Try to think realistically about stressful events rather than ignoring them or overreacting. Try to find the positives in a stressful situation rather  than focusing on the negatives.  Exercise. Physical exercise can release both physical and emotional tension. The key is to find a form of exercise you enjoy and do it regularly.  Relaxation techniques. These relax the body and mind. Examples include yoga, meditation, tai chi, biofeedback, deep breathing, progressive muscle relaxation, listening to music, being out in nature, journaling, and other hobbies. Again, the key is to find one or more that you enjoy and can do regularly.  Healthy lifestyle. Eat a balanced diet, get plenty of sleep, and do not smoke. Avoid using alcohol or drugs to relax.  Strong support network. Spend time with family, friends, or other people you enjoy being around.Express your feelings and talk things over with someone you trust. Counseling or talktherapy with a mental health professional may be helpful if you are having difficulty managing stress on your own. Medicine is typically not recommended for the treatment of stress.Talk to your health care provider if you think you need medicine for symptoms of stress. HOME CARE INSTRUCTIONS  Keep all follow-up visits as directed by your health care provider.  Take all medicines as directed by your health care provider. SEEK MEDICAL CARE IF:  Your symptoms get worse or you start having new symptoms.  You feel overwhelmed by your problems and can no longer manage them on your own. SEEK IMMEDIATE MEDICAL CARE IF:  You feel like hurting yourself or someone else. Document Released: 05/20/2001 Document Revised: 04/10/2014 Document Reviewed: 07/19/2013 [facility] Patient Information 2015 [place], [place]. This information is not intended to replace advice given to you by your health care provider. Make sure you discuss any questions you have with your health care provider.  Stress Stress-related medical problems are becoming increasingly common. The body has a built-in physical response to stressful situations. Faced with  pressure, challenge or danger, we need to react quickly. Our bodies release hormones such as cortisol and adrenaline to help do this. These hormones are part of the "fight or flight" response and affect the metabolic rate, heart rate and blood pressure, resulting in a heightened, stressed state that prepares the body for optimum performance in dealing with a stressful situation. It is likely that early man required these mechanisms to stay alive, but usually modern stresses do not call for this, and the same hormones released in today's world can damage health and reduce coping ability. CAUSES  Pressure to perform at work, at school or in sports.  Threats of physical violence.  Money worries.  Arguments.  Family conflicts.  Divorce or separation from significant other.  Bereavement.  New job or unemployment.  Changes in location.  Alcohol or drug abuse. SOMETIMES, THERE IS NO PARTICULAR REASON FOR DEVELOPING STRESS. Almost all people are at risk of being stressed at some time in their lives. It is important to know that some stress is temporary and some is long term.  Temporary stress will go away when a situation is resolved. Most people can cope with  short periods of stress, and it can often be relieved by relaxing, taking a walk or getting any type of exercise, chatting through issues with friends, or having a good night's sleep.  Chronic (long-term, continuous) stress is much harder to deal with. It can be psychologically and emotionally damaging. It can be harmful both for an individual and for friends and family. SYMPTOMS Everyone reacts to stress differently. There are some common effects that help [place] recognize it. In times of extreme stress, people may:  Shake uncontrollably.  Breathe faster and deeper than normal (hyperventilate).  Vomit.  For people with asthma, stress can trigger an attack.  For some people, stress may trigger migraine headaches, ulcers, and body  pain. PHYSICAL EFFECTS OF STRESS MAY INCLUDE:  Loss of energy.  Skin problems.  Aches and pains resulting from tense muscles, including neck ache, backache and tension headaches.  Increased pain from arthritis and other conditions.  Irregular heart beat (palpitations).  Periods of irritability or anger.  Apathy or depression.  Anxiety (feeling uptight or worrying).  Unusual behavior.  Loss of appetite.  Comfort eating.  Lack of concentration.  Loss of, or decreased, sex-drive.  Increased smoking, drinking, or recreational drug use.  For women, missed periods.  Ulcers, joint pain, and muscle pain. Post-traumatic stress is the stress caused by any serious accident, strong emotional damage, or extremely difficult or violent experience such as rape or war. Post-traumatic stress victims can experience mixtures of emotions such as fear, shame, depression, guilt or anger. It may include recurrent memories or images that may be haunting. These feelings can last for weeks, months or even years after the traumatic event that triggered them. Specialized treatment, possibly with medicines and psychological therapies, is available. If stress is causing physical symptoms, severe distress or making it difficult for you to function as normal, it is worth seeing your caregiver. It is important to remember that although stress is a usual part of life, extreme or prolonged stress can lead to other illnesses that will need treatment. It is better to visit a doctor sooner rather than later. Stress has been linked to the development of high blood pressure and heart disease, as well as insomnia and depression. There is no diagnostic test for stress since everyone reacts to it differently. But a caregiver will be able to spot the physical symptoms, such as:  Headaches.  Shingles.  Ulcers. Emotional distress such as intense worry, low mood or irritability should be detected when the doctor asks  pertinent questions to identify any underlying problems that might be the cause. In case there are physical reasons for the symptoms, the doctor may also want to do some tests to exclude certain conditions. If you feel that you are suffering from stress, try to identify the aspects of your life that are causing it. Sometimes you may not be able to change or avoid them, but even a small change can have a positive ripple effect. A simple lifestyle change can make all the difference. STRATEGIES THAT CAN HELP DEAL WITH STRESS:  Delegating or sharing responsibilities.  Avoiding confrontations.  Learning to be more assertive.  Regular exercise.  Avoid using alcohol or street drugs to cope.  Eating a healthy, balanced diet, rich in fruit and vegetables and proteins.  Finding humor or absurdity in stressful situations.  Never taking on more than you know you can handle comfortably.  Organizing your time better to get as much done as possible.  Talking to friends or family  and sharing your thoughts and fears.  Listening to music or relaxation tapes.  Relaxation techniques like deep breathing, meditation, and yoga.  Tensing and then relaxing your muscles, starting at the toes and working up to the head and neck. If you think that you would benefit from help, either in identifying the things that are causing your stress or in learning techniques to help you relax, see a caregiver who is capable of helping you with this. Rather than relying on medications, it is usually better to try and identify the things in your life that are causing stress and try to deal with them. There are many techniques of managing stress including counseling, psychotherapy, aromatherapy, yoga, and exercise. Your caregiver can help you determine what is best for you. Document Released: 02/14/2003 Document Revised: 11/29/2013 Document Reviewed: 01/11/2008 [facility] Patient Information 2015 [place], [place]. This information  is not intended to replace advice given to you by your health care provider. Make sure you discuss any questions you have with your health care provider.  Urinary Frequency The number of times a normal person urinates depends upon how much liquid they take in and how much liquid they are losing. If the temperature is hot and there is high humidity, then the person will sweat more and usually breathe a little more frequently. These factors decrease the amount of frequency of urination that would be considered normal. The amount you drink is easily determined, but the amount of fluid lost is sometimes more difficult to calculate.  Fluid is lost in two ways:  Sensible fluid loss is usually measured by the amount of urine that you get rid of. Losses of fluid can also occur with diarrhea.  Insensible fluid loss is more difficult to measure. It is caused by evaporation. Insensible loss of fluid occurs through breathing and sweating. It usually ranges from a little less than a quart to a little more than a quart of fluid a day. In normal temperatures and activity levels, the average person may urinate 4 to 7 times in a 24-hour period. Needing to urinate more often than that could indicate a problem. If one urinates 4 to 7 times in 24 hours and has large volumes each time, that could indicate a different problem from one who urinates 4 to 7 times a day and has small volumes. The time of urinating is also important. Most urinating should be done during the waking hours. Getting up at night to urinate frequently can indicate some problems. CAUSES  The bladder is the organ in your lower abdomen that holds urine. Like a balloon, it swells some as it fills up. Your nerves sense this and tell you it is time to head for the bathroom. There are a number of reasons that you might feel the need to urinate more often than usual. They include:  Urinary tract infection. This is usually associated with other signs such as  burning when you urinate.  In men, problems with the prostate (a walnut-size gland that is located near the tube that carries urine out of your body). There are two reasons why the prostate can cause an increased frequency of urination:  An enlarged prostate that does not let the bladder empty well. If the bladder only half empties when you urinate, then it only has half the capacity to fill before you have to urinate again.  The nerves in the bladder become more hypersensitive with an increased size of the prostate even if the bladder empties completely.  Pregnancy.  Obesity. Excess weight is more likely to cause a problem for women than for men.  Bladder stones or other bladder problems.  Caffeine.  Alcohol.  Medications. For example, drugs that help the body get rid of extra fluid (diuretics) increase urine production. Some other medicines must be taken with lots of fluids.  Muscle or nerve weakness. This might be the result of a spinal cord injury, a stroke, multiple sclerosis, or Parkinson disease.  Long-standing diabetes can decrease the sensation of the bladder. This loss of sensation makes it harder to sense the bladder needs to be emptied. Over a period of years, the bladder is stretched out by constant overfilling. This weakens the bladder muscles so that the bladder does not empty well and has less capacity to fill with new urine.  Interstitial cystitis (also called painful bladder syndrome). This condition develops because the tissues that line the inside of the bladder are inflamed (inflammation is the body's way of reacting to injury or infection). It causes pain and frequent urination. It occurs in women more often than in men. DIAGNOSIS   To decide what might be causing your urinary frequency, your health care provider will probably:  Ask about symptoms you have noticed.  Ask about your overall health. This will include questions about any medications you are  taking.  Do a physical examination.  Order some tests. These might include:  A blood test to check for diabetes or other health issues that could be contributing to the problem.  Urine testing. This could measure the flow of urine and the pressure on the bladder.  A test of your neurological system (the brain, spinal cord, and nerves). This is the system that senses the need to urinate.  A bladder test to check whether it is emptying completely when you urinate.  Cystoscopy. This test uses a thin tube with a tiny camera on it. It offers a look inside your urethra and bladder to see if there are problems.  Imaging tests. You might be given a contrast dye and then asked to urinate. X-rays are taken to see how your bladder is working. TREATMENT  It is important for you to be evaluated to determine if the amount or frequency that you have is unusual or abnormal. If it is found to be abnormal, the cause should be determined and this can usually be found out easily. Depending upon the cause, treatment could include medication, stimulation of the nerves, or surgery. There are not too many things that you can do as an individual to change your urinary frequency. It is important that you balance the amount of fluid intake needed to compensate for your activity and the temperature. Medical problems will be diagnosed and taken care of by your physician. There is no particular bladder training such as Kegel exercises that you can do to help urinary frequency. This is an exercise that is usually recommended for people who have leaking of urine when they laugh, cough, or sneeze. HOME CARE INSTRUCTIONS   Take any medications your health care provider prescribed or suggested. Follow the directions carefully.  Practice any lifestyle changes that are recommended. These might include:  Drinking less fluid or drinking at different times of the day. If you need to urinate often during the night, for example,  you may need to stop drinking fluids early in the evening.  Cutting down on caffeine or alcohol. They both can make you need to urinate more often than normal. Caffeine is  found in coffee, tea, and sodas.  Losing weight, if that is recommended.  Keep a journal or a log. You might be asked to record how much you drink and when and where you feel the need to urinate. This will also help evaluate how well the treatment provided by your physician is working. SEEK MEDICAL CARE IF:   Your need to urinate often gets worse.  You feel increased pain or irritation when you urinate.  You notice blood in your urine.  You have questions about any medications that your health care provider recommended.  You notice blood, pus, or swelling at the site of any test or treatment procedure.  You develop a fever of more than 100.39F (38.1C). SEEK IMMEDIATE MEDICAL CARE IF:  You develop a fever of more than 102.39F (38.9C). Document Released: 09/20/2009 Document Revised: 04/10/2014 Document Reviewed: 09/20/2009 [facility] Patient Information 2015 [place], [place]. This information is not intended to replace advice given to you by your health care provider. Make sure you discuss any questions you have with your health care provider.

## 2015-08-28 NOTE — ED Notes (Signed)
Patient is aware we need urine 

## 2015-08-29 LAB — URINE CULTURE

## 2016-04-16 IMAGING — CR DG HIP (WITH OR WITHOUT PELVIS) 2-3V*L*
2 series · 2 of 2 positions shown · non-contrast
Comparison: None.

CLINICAL DATA: Left hip pain for 3 weeks, no known injury, initial
encounter

EXAM:
DG HIP (WITH OR WITHOUT PELVIS) 2-3V LEFT

[w hip lat left]
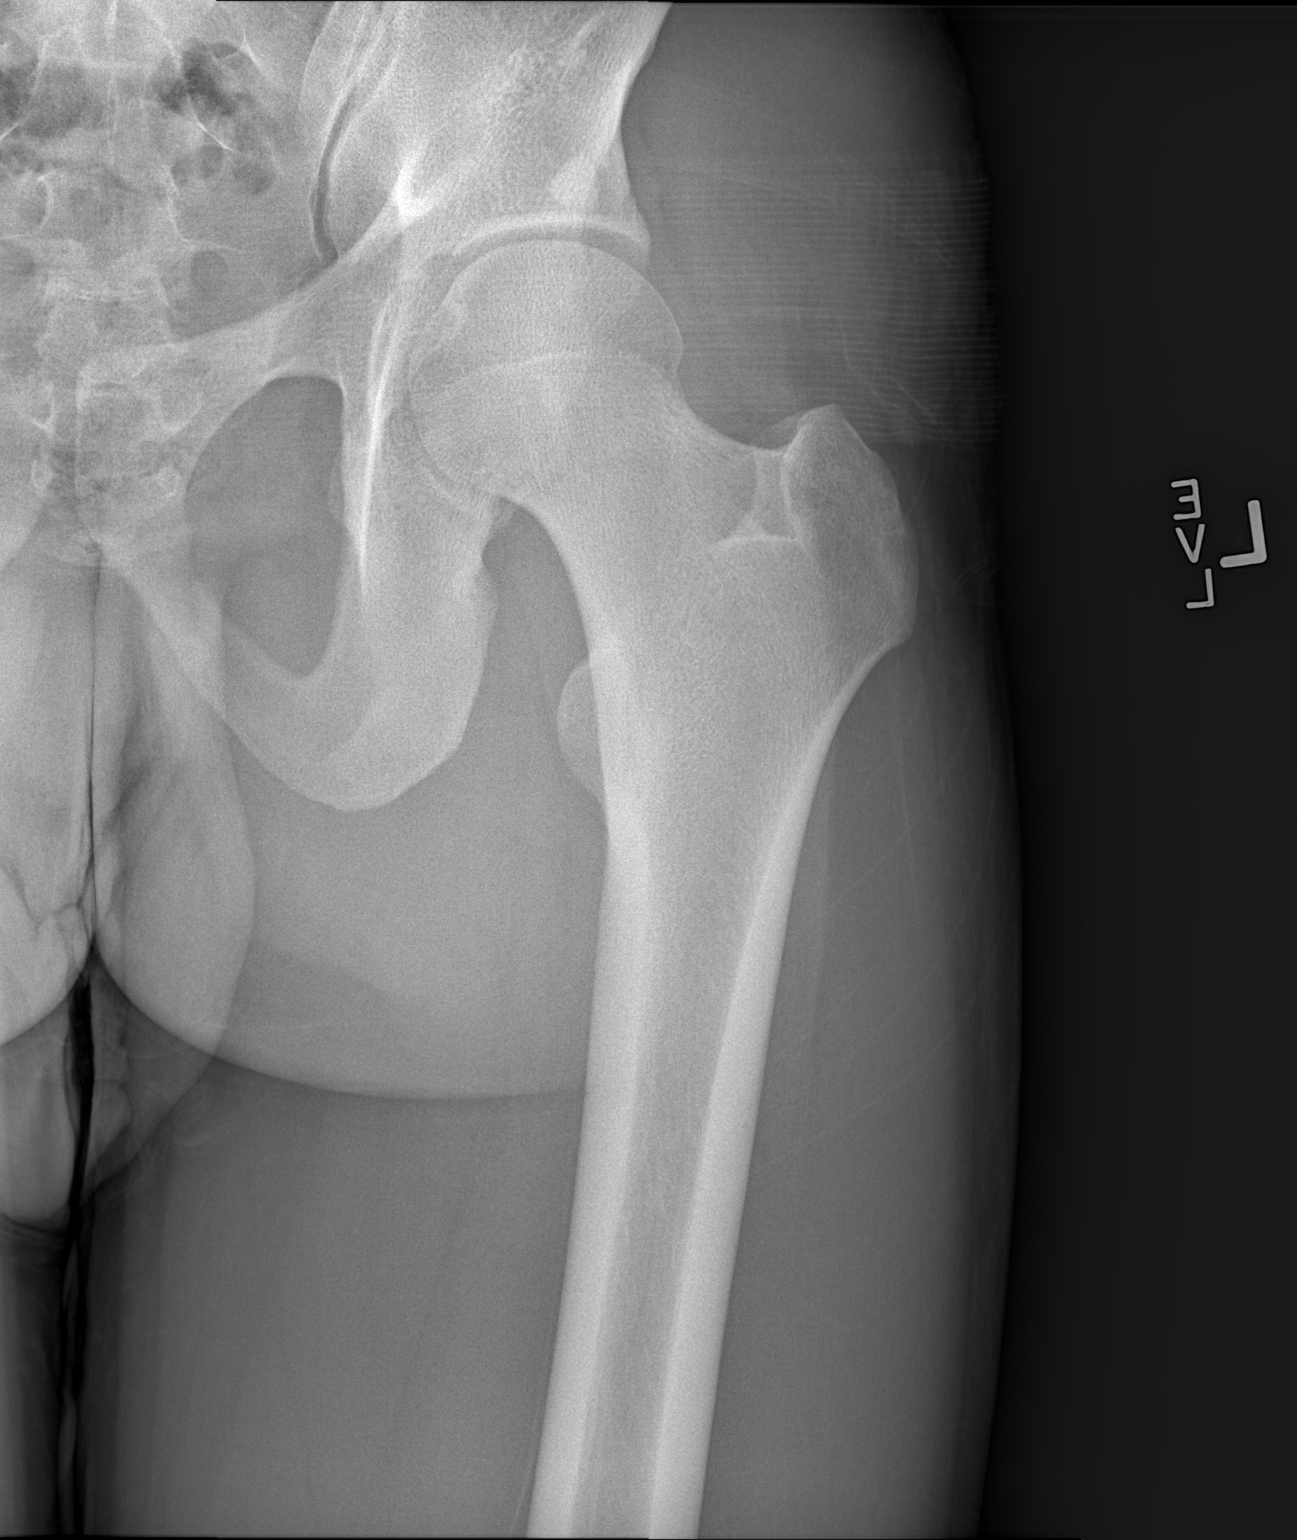

[t hip frog leg left]
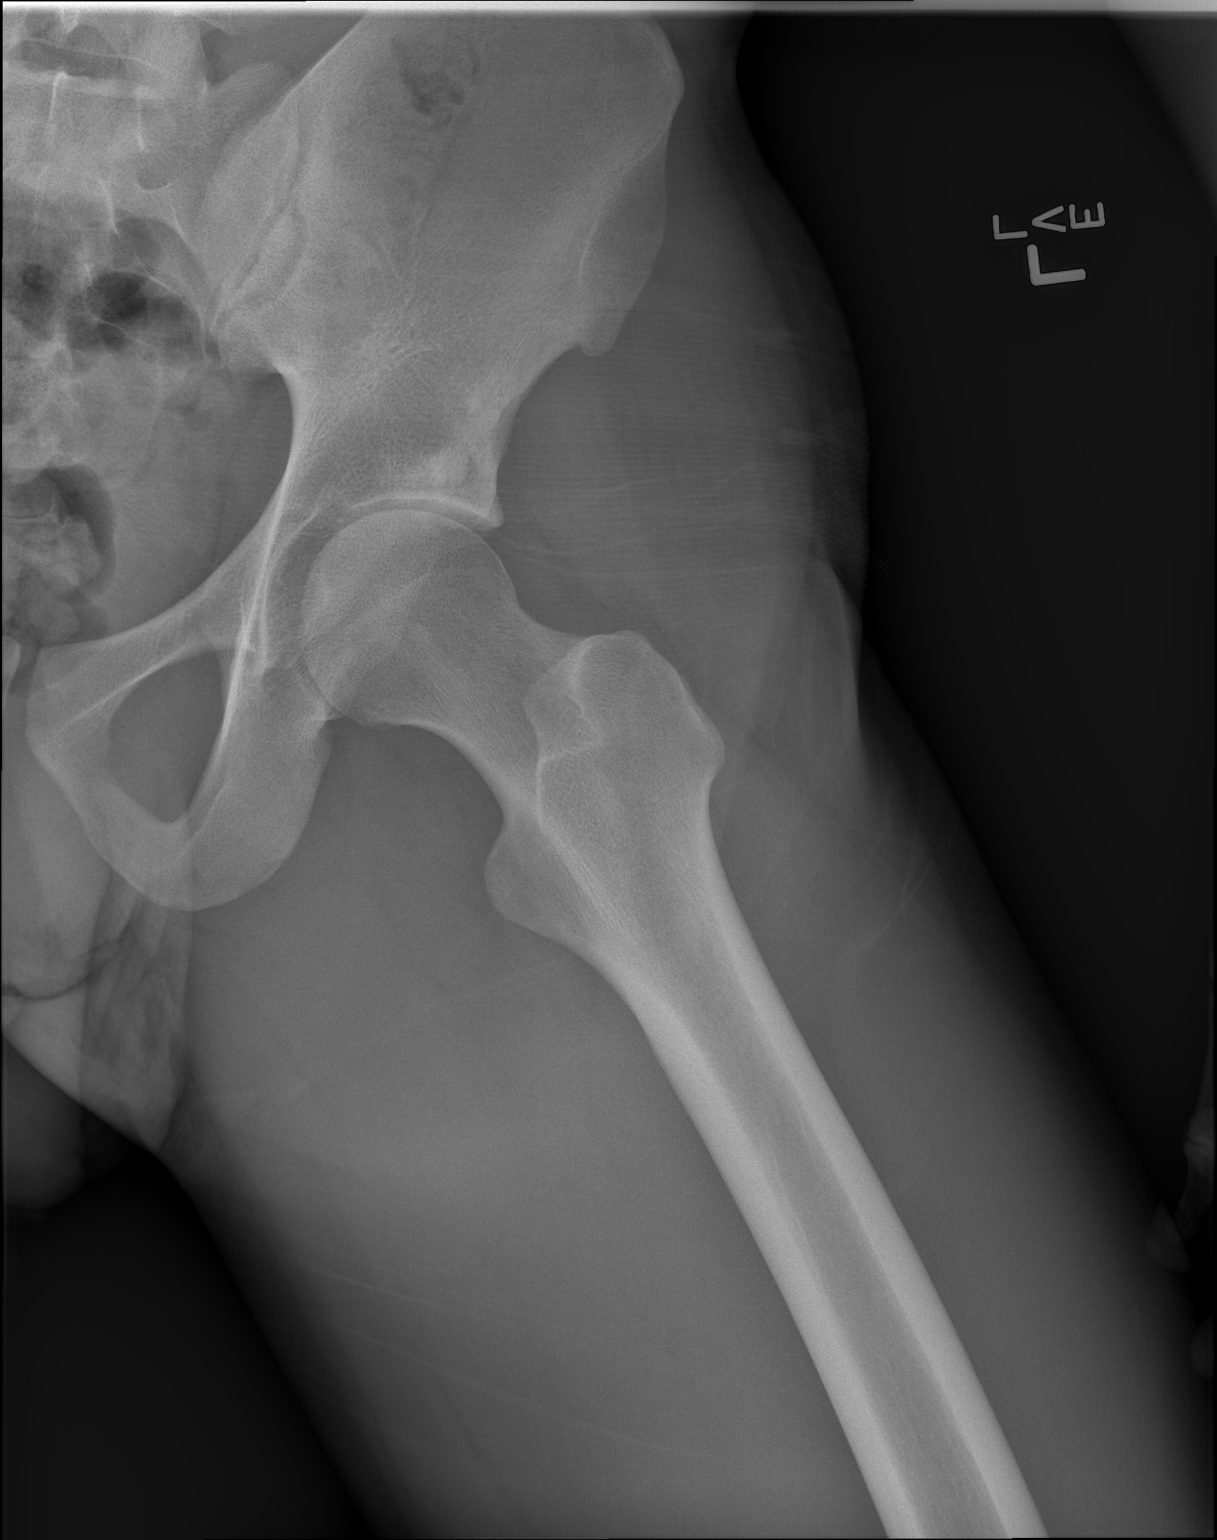

[2 of 2 positions shown; findings below may reference images not displayed]

FINDINGS: There is no evidence of hip fracture or dislocation. There is no
evidence of arthropathy or other focal bone abnormality.
IMPRESSION: No acute abnormality noted.

## 2018-02-11 ENCOUNTER — Ambulatory Visit (HOSPITAL_COMMUNITY)
Admission: EM | Admit: 2018-02-11 | Discharge: 2018-02-11 | Disposition: A | Discharge status: Home / Self Care | Payer: Managed Care, Other (non HMO) | Attending: Family Medicine | Admitting: Family Medicine

## 2018-02-11 ENCOUNTER — Encounter (HOSPITAL_COMMUNITY): Payer: Self-pay | Admitting: Family Medicine

## 2018-02-11 DIAGNOSIS — R369 Urethral discharge, unspecified: Secondary | ICD-10-CM

## 2018-02-11 DIAGNOSIS — F17210 Nicotine dependence, cigarettes, uncomplicated: Secondary | ICD-10-CM | POA: Insufficient documentation

## 2018-02-11 LAB — POCT URINALYSIS DIP (DEVICE)
Bilirubin Urine: NEGATIVE
Glucose, UA: NEGATIVE mg/dL
Hgb urine dipstick: NEGATIVE
Ketones, ur: NEGATIVE mg/dL
Nitrite: NEGATIVE
Protein, ur: NEGATIVE mg/dL
SPECIFIC GRAVITY, URINE: 1.025 (ref 1.005–1.030)
UROBILINOGEN UA: 0.2 mg/dL (ref 0.0–1.0)
pH: 5.5 (ref 5.0–8.0)

## 2018-02-11 MED ORDER — CEFTRIAXONE SODIUM 250 MG IJ SOLR
INTRAMUSCULAR | Status: AC
Start: 2018-02-11 — End: 2018-02-11
  Filled 2018-02-11: qty 250

## 2018-02-11 MED ORDER — AZITHROMYCIN 250 MG PO TABS
ORAL_TABLET | ORAL | Status: AC
  Filled 2018-02-11: qty 4

## 2018-02-11 MED ORDER — AZITHROMYCIN 250 MG PO TABS
1000.0000 mg | ORAL_TABLET | Freq: Once | ORAL | Status: AC
  Administered 2018-02-11: 1000 mg via ORAL

## 2018-02-11 MED ORDER — CEFTRIAXONE SODIUM 250 MG IJ SOLR
250.0000 mg | Freq: Once | INTRAMUSCULAR | Status: AC
  Administered 2018-02-11: 250 mg via INTRAMUSCULAR

## 2018-02-11 NOTE — Discharge Instructions (Signed)

## 2018-02-11 NOTE — ED Triage Notes (Signed)
Pt here for penile discharge and burning x 2 weeks.

## 2018-02-11 NOTE — ED Provider Notes (Signed)
  [facility]   [id number] 02/11/18 Arrival Time: 1035  ASSESSMENT & PLAN:  1. Penile discharge     Meds ordered this encounter  Medications  . cefTRIAXone (ROCEPHIN) injection 250 mg  . azithromycin (ZITHROMAX) tablet 1,000 mg   Pending: Labs Reviewed  URINE CYTOLOGY ANCILLARY ONLY   Will notify of any positive results. Instructed to refrain from sexual activity for at least seven days.  Reviewed expectations re: course of current medical issues. Questions answered. Outlined signs and symptoms indicating need for more acute intervention. Patient verbalized understanding. After Visit Summary given.   SUBJECTIVE:  Jonathan Williamson is a 27 y.o. male who presents with complaint of vaginal discharge. Onset gradual, 2 weeks ago. Describes discharge as thick and white/gray. Urinary symptoms: none. Afebrile. No abdominal or pelvic pain. No n/v. No rashes or lesions. Sexually active with single male partner. OTC treatment: none. History of similar symptoms: No.  ROS: As per HPI.  OBJECTIVE:  Vitals:   02/11/18 1051  BP: (!) 157/96  Pulse: (!) 104  Resp: 18  Temp: 98.4 F (36.9 C)     General appearance: alert, cooperative, appears stated age and no distress Throat: lips, mucosa, and tongue normal; teeth and gums normal Back: no CVA tenderness Abdomen: soft, non-tender; bowel sounds normal; no masses or organomegaly; no guarding or rebound tenderness GU: declines Skin: warm and dry Psychological:  Alert and cooperative. Normal mood and affect.   Labs Reviewed  URINE CYTOLOGY ANCILLARY ONLY    No Known Allergies   Social History   Socioeconomic History  . Marital status: Single    Spouse name: Not on file  . Number of children: Not on file  . Years of education: Not on file  . Highest education level: Not on file  Social Needs  . Financial resource strain: Not on file  . Food insecurity - worry: Not on file  . Food insecurity - inability: Not on  file  . Transportation needs - medical: Not on file  . Transportation needs - non-medical: Not on file  Occupational History  . Not on file  Tobacco Use  . Smoking status: Current Every Day Smoker  . Tobacco comment: pt vvapes  Substance and Sexual Activity  . Alcohol use: Yes    Comment: occasion  . Drug use: No  . Sexual activity: Not on file  Other Topics Concern  . Not on file  Social History Narrative  . Not on file          Mardella LaymanHagler, Olesya Wike, MD 02/11/18 [phone]

## 2018-02-13 LAB — URINE CYTOLOGY ANCILLARY ONLY
CHLAMYDIA, DNA PROBE: POSITIVE — AB
NEISSERIA GONORRHEA: POSITIVE — AB
TRICH (WINDOWPATH): NEGATIVE

## 2021-11-19 ENCOUNTER — Encounter (HOSPITAL_COMMUNITY): Payer: Self-pay | Admitting: Emergency Medicine

## 2021-11-19 ENCOUNTER — Other Ambulatory Visit: Payer: Self-pay

## 2021-11-19 ENCOUNTER — Ambulatory Visit (HOSPITAL_COMMUNITY)
Admission: EM | Admit: 2021-11-19 | Discharge: 2021-11-19 | Disposition: A | Discharge status: Home / Self Care | Payer: Self-pay | Attending: Physician Assistant | Admitting: Physician Assistant

## 2021-11-19 DIAGNOSIS — J029 Acute pharyngitis, unspecified: Secondary | ICD-10-CM | POA: Diagnosis present

## 2021-11-19 DIAGNOSIS — R509 Fever, unspecified: Secondary | ICD-10-CM | POA: Insufficient documentation

## 2021-11-19 DIAGNOSIS — J039 Acute tonsillitis, unspecified: Secondary | ICD-10-CM | POA: Diagnosis present

## 2021-11-19 LAB — CBC WITH DIFFERENTIAL/PLATELET
Abs Immature Granulocytes: 0.13 10*3/uL — ABNORMAL HIGH (ref 0.00–0.07)
Basophils Absolute: 0.1 10*3/uL (ref 0.0–0.1)
Basophils Relative: 0 %
Eosinophils Absolute: 0 10*3/uL (ref 0.0–0.5)
Eosinophils Relative: 0 %
HCT: 43.9 % (ref 39.0–52.0)
Hemoglobin: 15.3 g/dL (ref 13.0–17.0)
Immature Granulocytes: 1 %
Lymphocytes Relative: 6 %
Lymphs Abs: 1.1 10*3/uL (ref 0.7–4.0)
MCH: 31.2 pg (ref 26.0–34.0)
MCHC: 34.9 g/dL (ref 30.0–36.0)
MCV: 89.4 fL (ref 80.0–100.0)
Monocytes Absolute: 1.4 10*3/uL — ABNORMAL HIGH (ref 0.1–1.0)
Monocytes Relative: 8 %
Neutro Abs: 13.8 10*3/uL — ABNORMAL HIGH (ref 1.7–7.7)
Neutrophils Relative %: 85 %
Platelets: 233 10*3/uL (ref 150–400)
RBC: 4.91 MIL/uL (ref 4.22–5.81)
RDW: 12.7 % (ref 11.5–15.5)
WBC: 16.4 10*3/uL — ABNORMAL HIGH (ref 4.0–10.5)
nRBC: 0 % (ref 0.0–0.2)

## 2021-11-19 LAB — POCT INFECTIOUS MONO SCREEN, ED / UC: Mono Screen: NEGATIVE

## 2021-11-19 LAB — COMPREHENSIVE METABOLIC PANEL
ALT: 85 U/L — ABNORMAL HIGH (ref 0–44)
AST: 52 U/L — ABNORMAL HIGH (ref 15–41)
Albumin: 3.9 g/dL (ref 3.5–5.0)
Alkaline Phosphatase: 73 U/L (ref 38–126)
Anion gap: 11 (ref 5–15)
BUN: 11 mg/dL (ref 6–20)
CO2: 24 mmol/L (ref 22–32)
Calcium: 9.5 mg/dL (ref 8.9–10.3)
Chloride: 102 mmol/L (ref 98–111)
Creatinine, Ser: 1.41 mg/dL — ABNORMAL HIGH (ref 0.61–1.24)
GFR, Estimated: >60 mL/min (ref >60)
Glucose, Bld: 111 mg/dL — ABNORMAL HIGH (ref 70–99)
Potassium: 4 mmol/L (ref 3.5–5.1)
Sodium: 137 mmol/L (ref 135–145)
Total Bilirubin: 0.8 mg/dL (ref 0.3–1.2)
Total Protein: 8.1 g/dL (ref 6.5–8.1)

## 2021-11-19 LAB — POCT RAPID STREP A, ED / UC: Streptococcus, Group A Screen (Direct): NEGATIVE

## 2021-11-19 MED ORDER — ACETAMINOPHEN 325 MG PO TABS
650.0000 mg | ORAL_TABLET | Freq: Once | ORAL | Status: AC
  Administered 2021-11-19: 650 mg via ORAL

## 2021-11-19 MED ORDER — ACETAMINOPHEN 325 MG PO TABS
ORAL_TABLET | ORAL | Status: AC
  Filled 2021-11-19: qty 2

## 2021-11-19 MED ORDER — AMOXICILLIN-POT CLAVULANATE 400-57 MG/5ML PO SUSR: 875.0000 mg | Freq: Two times a day (BID) | ORAL | 0 refills | Status: AC

## 2021-11-19 MED ORDER — CEFTRIAXONE SODIUM 1 G IJ SOLR
INTRAMUSCULAR | Status: AC
  Filled 2021-11-19: qty 10

## 2021-11-19 MED ORDER — CEFTRIAXONE SODIUM 1 G IJ SOLR
1.0000 g | Freq: Once | INTRAMUSCULAR | Status: AC
  Administered 2021-11-19: 1 g via INTRAMUSCULAR

## 2021-11-19 MED ORDER — LIDOCAINE HCL (PF) 1 % IJ SOLN
INTRAMUSCULAR | Status: AC
  Filled 2021-11-19: qty 2

## 2021-11-19 NOTE — ED Provider Notes (Signed)
[facility]    CSN: [id number] Arrival date & time: 11/19/21  5830      History   Chief Complaint Chief Complaint  Patient presents with   Sore Throat    HPI Jonathan Williamson is a 30 y.o. male.   Patient presents today with a 2-day history of worsening sore throat.  Reports pain is rated 10 on a 0-10 pain scale, localized to posterior oropharynx, described as aching with periodic sharp pains particularly with swallowing, no alleviating factors identified.  He has tried DayQuil with temporary improvement of symptoms.  Denies any known sick contacts.  He does report fever, body aches, fatigue, malaise.  Denies any chest pain, shortness of breath, nausea, vomiting, cough, congestion.  Denies any recent antibiotic use.  He does report muffled voice but is able to swallow his secretions.  Denies history of peritonsillar abscess.   History reviewed. No pertinent past medical history.  There are no problems to display for this patient.   History reviewed. No pertinent surgical history.     Home Medications    Prior to Admission medications   Medication Sig Start Date End Date Taking? Authorizing Provider  amoxicillin-clavulanate (AUGMENTIN) 400-57 MG/5ML suspension Take 10.9 mLs (875 mg total) by mouth 2 (two) times daily for 10 days. 11/19/21 11/29/21 Yes Jamarrion Budai K, PA-C  Aspirin-Acetaminophen-Caffeine (GOODY HEADACHE PO) Take 1-2 each by mouth every 8 (eight) hours as needed (for pain).     [provider]  clotrimazole (LOTRIMIN) 1 % cream Apply to affected area 2 times daily Patient not taking: Reported on 08/28/2015 08/21/15   Everlene Farrier, PA-C  ibuprofen (ADVIL,MOTRIN) 200 MG tablet Take 400 mg by mouth every 6 (six) hours as needed for fever, headache, mild pain, moderate pain or cramping.    [provider]  naproxen (NAPROSYN) 500 MG tablet Take 1 tablet (500 mg total) by mouth 2 (two) times daily with a meal. Patient not taking:  Reported on 08/21/2015 04/09/15   Renne Crigler, PA-C  traMADol (ULTRAM) 50 MG tablet Take 1 tablet (50 mg total) by mouth every 6 (six) hours as needed. Patient not taking: Reported on 08/21/2015 06/18/15   Teressa Lower, NP    Family History Family History  Problem Relation Age of Onset   Healthy Mother    Healthy Father     Social History Social History   Tobacco Use   Smoking status: Some Days    Types: Cigarettes   Tobacco comments:    pt vvapes  Vaping Use   Vaping Use: Some days  Substance Use Topics   Alcohol use: Yes    Comment: occasion   Drug use: No     Allergies   Patient has no known allergies.   Review of Systems Review of Systems  Constitutional:  Positive for activity change, appetite change, chills, fatigue and fever.  HENT:  Positive for sore throat, trouble swallowing and voice change. Negative for congestion, sinus pressure and sneezing.   Respiratory:  Negative for cough and shortness of breath.   Cardiovascular:  Negative for chest pain.  Gastrointestinal:  Negative for abdominal pain, diarrhea, nausea and vomiting.  Musculoskeletal:  Positive for arthralgias and myalgias.  Neurological:  Positive for headaches. Negative for dizziness and light-headedness.    Physical Exam Triage Vital Signs ED Triage Vitals  Enc Vitals Group     BP 11/19/21 1018 (!) 124/96     Pulse Rate 11/19/21 1018 (!) 105     Resp  11/19/21 1018 20     Temp 11/19/21 1018 (!) 100.6 F (38.1 C)     Temp Source 11/19/21 1018 Oral     SpO2 11/19/21 1018 98 %     Weight --      Height --      Head Circumference --      Peak Flow --      Pain Score 11/19/21 1015 7     Pain Loc --      Pain Edu? --      Excl. in GC? --    No data found.  Updated Vital Signs BP (!) 124/96 (BP Location: Left Arm)    Pulse (!) 105    Temp (!) 100.6 F (38.1 C) (Oral)    Resp 20    SpO2 98%   Visual Acuity Right Eye Distance:   Left Eye Distance:   Bilateral Distance:    Right  Eye Near:   Left Eye Near:    Bilateral Near:     Physical Exam Vitals reviewed.  Constitutional:      General: He is awake.     Appearance: Normal appearance. He is well-developed. He is ill-appearing.     Comments: Very pleasant male appears stated in no acute distress  HENT:     Head: Normocephalic and atraumatic.     Right Ear: Tympanic membrane, ear canal and external ear normal. Tympanic membrane is not erythematous or bulging.     Left Ear: Tympanic membrane, ear canal and external ear normal. Tympanic membrane is not erythematous or bulging.     Nose: Nose normal.     Mouth/Throat:     Pharynx: Uvula midline. Posterior oropharyngeal erythema present. No oropharyngeal exudate or uvula swelling.     Tonsils: Tonsillar exudate present. 3+ on the right. 3+ on the left.  Cardiovascular:     Rate and Rhythm: Normal rate and regular rhythm.     Heart sounds: Normal heart sounds, S1 normal and S2 normal. No murmur heard. Pulmonary:     Effort: Pulmonary effort is normal. No accessory muscle usage or respiratory distress.     Breath sounds: Normal breath sounds. No stridor. No wheezing, rhonchi or rales.     Comments: Clear auscultation bilaterally Abdominal:     General: Bowel sounds are normal.     Palpations: Abdomen is soft.     Tenderness: There is no abdominal tenderness.  Neurological:     Mental Status: He is alert.  Psychiatric:        Behavior: Behavior is cooperative.     UC Treatments / Results  Labs (all labs ordered are listed, but only abnormal results are displayed) Labs Reviewed  CULTURE, GROUP A STREP ([facility])  CBC WITH DIFFERENTIAL/PLATELET  COMPREHENSIVE METABOLIC PANEL  POCT RAPID STREP A, ED / UC  POCT INFECTIOUS MONO SCREEN, ED / UC    EKG   Radiology No results found.  Procedures Procedures (including critical care time)  Medications Ordered in UC Medications  cefTRIAXone (ROCEPHIN) injection 1 g (has no administration in time range)   acetaminophen (TYLENOL) tablet 650 mg (650 mg Oral Given 11/19/21 1027)    Initial Impression / Assessment and Plan / UC Course  I have reviewed the triage vital signs and the nursing notes.  Pertinent labs & imaging results that were available during my care of the patient were reviewed by me and considered in my medical decision making (see chart for details).     Strep and  mono were negative in clinic.  Throat culture is pending.  Concern for beginning of peritonsillar abscess given clinical presentation.  Will empirically treat with antibiotics and patient was given 1 g of Rocephin in clinic and started on Augmentin 875/125 twice daily for 7 days.  CBC and CMP were obtained today-results pending.  If he has significant leukocytosis or abnormal kidney function he will need to go to the emergency room.  He was given contact information for ENT and encouraged to call to schedule appointment as soon as possible.  Discussed that if his symptoms are improving quickly or if anything worsens that he is having difficulty speaking, difficulty swallowing, shortness of breath, swelling of his throat, difficulty swallowing his saliva he must go to the emergency room.  Strict return precautions given to which he expressed understanding.  Recommended follow-up (assuming improvement of symptoms) within 24 to 48 hours ideally with ENT but can return to our clinic as long as symptoms are improving.  Final Clinical Impressions(s) / UC Diagnoses   Final diagnoses:  Exudative tonsillitis  Sore throat     Discharge Instructions      I am concerned that you have a very severe infection.  Your testing was negative today but we are going to start you on antibiotics.  We gave you a shot of Rocephin and I would like you to start Augmentin twice daily.  Recommend you follow-up with an ear nose and throat doctor as we discussed.  If anything worsens and you are having difficulty swallowing, shortness of breath,  trouble swallowing your own saliva, feeling that your throat is swelling, difficulty speaking you must go to the emergency room as we discussed.  You should follow-up with someone within a few days to make sure this is not worsening.  I will contact you if your lab work is abnormal at which point you need to go to the ER.     ED Prescriptions     Medication Sig Dispense Auth. Provider   amoxicillin-clavulanate (AUGMENTIN) 400-57 MG/5ML suspension Take 10.9 mLs (875 mg total) by mouth 2 (two) times daily for 10 days. 220 mL Taytum Wheller K, PA-C      PDMP not reviewed this encounter.   Jeani Hawking, PA-C 11/19/21 1124

## 2021-11-19 NOTE — ED Triage Notes (Signed)
Patient started feeling bad 11/17/2021.  Complains of sore throat, slight fever, body aches.  Patient has a headache

## 2021-11-19 NOTE — Discharge Instructions (Signed)
I am concerned that you have a very severe infection.  Your testing was negative today but we are going to start you on antibiotics.  We gave you a shot of Rocephin and I would like you to start Augmentin twice daily.  Recommend you follow-up with an ear nose and throat doctor as we discussed.  If anything worsens and you are having difficulty swallowing, shortness of breath, trouble swallowing your own saliva, feeling that your throat is swelling, difficulty speaking you must go to the emergency room as we discussed.  You should follow-up with someone within a few days to make sure this is not worsening.  I will contact you if your lab work is abnormal at which point you need to go to the ER.

## 2021-11-20 LAB — CULTURE, GROUP A STREP (THRC)

## 2022-10-01 ENCOUNTER — Ambulatory Visit (HOSPITAL_COMMUNITY)
Admission: EM | Admit: 2022-10-01 | Discharge: 2022-10-01 | Disposition: A | Discharge status: Home / Self Care | Payer: Self-pay | Attending: Physician Assistant | Admitting: Physician Assistant

## 2022-10-01 ENCOUNTER — Ambulatory Visit (INDEPENDENT_AMBULATORY_CARE_PROVIDER_SITE_OTHER): Payer: Self-pay

## 2022-10-01 ENCOUNTER — Encounter (HOSPITAL_COMMUNITY): Payer: Self-pay | Admitting: Emergency Medicine

## 2022-10-01 DIAGNOSIS — R109 Unspecified abdominal pain: Secondary | ICD-10-CM

## 2022-10-01 DIAGNOSIS — M25512 Pain in left shoulder: Secondary | ICD-10-CM

## 2022-10-01 DIAGNOSIS — K529 Noninfective gastroenteritis and colitis, unspecified: Secondary | ICD-10-CM

## 2022-10-01 DIAGNOSIS — M549 Dorsalgia, unspecified: Secondary | ICD-10-CM

## 2022-10-01 MED ORDER — METHOCARBAMOL 500 MG PO TABS: 500.0000 mg | ORAL_TABLET | Freq: Two times a day (BID) | ORAL | 0 refills | Status: DC

## 2022-10-01 MED ORDER — ONDANSETRON 4 MG PO TBDP: 4.0000 mg | ORAL_TABLET | Freq: Three times a day (TID) | ORAL | 0 refills | Status: DC | PRN

## 2022-10-01 MED ORDER — ONDANSETRON 4 MG PO TBDP
ORAL_TABLET | ORAL | Status: AC
  Filled 2022-10-01: qty 1

## 2022-10-01 MED ORDER — ONDANSETRON 4 MG PO TBDP
4.0000 mg | ORAL_TABLET | Freq: Once | ORAL | Status: AC
  Administered 2022-10-01: 4 mg via ORAL

## 2022-10-01 MED ORDER — AMOXICILLIN-POT CLAVULANATE 875-125 MG PO TABS: 1.0000 | ORAL_TABLET | Freq: Two times a day (BID) | ORAL | 0 refills | Status: DC

## 2022-10-01 NOTE — ED Provider Notes (Signed)
[facility]    CSN: [id number] Arrival date & time: 10/01/22  1035      History   Chief Complaint Chief Complaint  Patient presents with   Shoulder Pain   Abdominal Pain   Diarrhea    HPI Jonathan Williamson is a 31 y.o. male.   Patient presents today with several concerns.  His primary concern today is 3 to 4-day history of left shoulder pain.  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  He reports that pain is now traveling down the left side of his back.  It is rated 8 on a 0-10 pain scale, described as aching, worse with certain movements, no alleviating factors identified.  He is right-handed.  Denies any numbness or paresthesias in left hand.  Denies previous injury or surgery involving his back or shoulder.  He has not tried any over-the-counter medication for symptom management.  In addition, patient reports that for the past 24 hours he has had GI upset.  Reports 4-5 watery bowel movements without blood or mucus.  Reports generalized abdominal pain that is worse on the left.  He reports associated nausea but denies any vomiting.  Denies any fever, melena, hematochezia, hematemesis.  He has not tried any over-the-counter medication for symptom management.  Denies history of gastrointestinal disorder including ulcerative colitis, Crohn's disease, diverticulitis.  Denies any suspicious food intake or known sick contacts.  Denies any medication changes, recent travel, antibiotic use.    History reviewed. No pertinent past medical history.  There are no problems to display for this patient.   History reviewed. No pertinent surgical history.     Home Medications    Prior to Admission medications   Medication Sig Start Date End Date Taking? Authorizing Provider  amoxicillin-clavulanate (AUGMENTIN) 875-125 MG tablet Take 1  tablet by mouth every 12 (twelve) hours. 10/01/22  Yes Aliahna Statzer K, PA-C  methocarbamol (ROBAXIN) 500 MG tablet Take 1 tablet (500 mg total) by mouth 2 (two) times daily. 10/01/22  Yes Timmie Dugue K, PA-C  ondansetron (ZOFRAN-ODT) 4 MG disintegrating tablet Take 1 tablet (4 mg total) by mouth every 8 (eight) hours as needed for nausea or vomiting. 10/01/22  Yes Julianah Marciel K, PA-C  Aspirin-Acetaminophen-Caffeine (GOODY HEADACHE PO) Take 1-2 each by mouth every 8 (eight) hours as needed (for pain).     [provider]  clotrimazole (LOTRIMIN) 1 % cream Apply to affected area 2 times daily Patient not taking: Reported on 08/28/2015 08/21/15   Everlene Farrier, PA-C  ibuprofen (ADVIL,MOTRIN) 200 MG tablet Take 400 mg by mouth every 6 (six) hours as needed for fever, headache, mild pain, moderate pain or cramping.    [provider]    Family History Family History  Problem Relation Age of Onset   Healthy Mother    Healthy Father     Social History Social History   Tobacco Use   Smoking status: Some Days    Types: Cigarettes   Tobacco comments:    pt vvapes  Vaping Use   Vaping Use: Some days  Substance Use Topics   Alcohol use: Yes    Comment: occasion   Drug use: No     Allergies   Patient has no known allergies.   Review of Systems Review of Systems  Constitutional:  Positive for activity change.  Negative for appetite change, fatigue and fever.  Respiratory:  Negative for cough and shortness of breath.   Cardiovascular:  Negative for chest pain.  Gastrointestinal:  Positive for abdominal pain, diarrhea and nausea. Negative for blood in stool, constipation and vomiting.  Musculoskeletal:  Positive for arthralgias. Negative for myalgias.  Neurological:  Negative for dizziness, weakness, light-headedness, numbness and headaches.     Physical Exam Triage Vital Signs ED Triage Vitals  Enc Vitals Group     BP 10/01/22 1244 (!) 154/104     Pulse Rate  10/01/22 1244 71     Resp 10/01/22 1244 18     Temp 10/01/22 1244 98.2 F (36.8 C)     Temp Source 10/01/22 1244 Oral     SpO2 10/01/22 1244 98 %     Weight --      Height --      Head Circumference --      Peak Flow --      Pain Score 10/01/22 1243 6     Pain Loc --      Pain Edu? --      Excl. in GC? --    No data found.  Updated Vital Signs BP (!) 154/104 (BP Location: Left Arm)   Pulse 71   Temp 98.2 F (36.8 C) (Oral)   Resp 18   SpO2 98%   Visual Acuity Right Eye Distance:   Left Eye Distance:   Bilateral Distance:    Right Eye Near:   Left Eye Near:    Bilateral Near:     Physical Exam Vitals reviewed.  Constitutional:      General: He is awake.     Appearance: Normal appearance. He is well-developed. He is not ill-appearing.     Comments: Very pleasant male appears stated age in no acute distress sitting comfortably in exam room  HENT:     Head: Normocephalic and atraumatic.     Mouth/Throat:     Pharynx: Uvula midline. No oropharyngeal exudate or posterior oropharyngeal erythema.  Cardiovascular:     Rate and Rhythm: Normal rate and regular rhythm.     Heart sounds: Normal heart sounds, S1 normal and S2 normal. No murmur heard. Pulmonary:     Effort: Pulmonary effort is normal.     Breath sounds: Normal breath sounds. No stridor. No wheezing, rhonchi or rales.     Comments: Clear to auscultation bilaterally Abdominal:     General: Bowel sounds are normal.     Palpations: Abdomen is soft.     Tenderness: There is abdominal tenderness in the left upper quadrant and left lower quadrant. There is no right CVA tenderness, left CVA tenderness, guarding or rebound.  Musculoskeletal:     Left shoulder: Tenderness present. No swelling or bony tenderness. Decreased range of motion. Normal strength.     Cervical back: Tenderness present. No bony tenderness.     Thoracic back: Tenderness present. No bony tenderness.     Lumbar back: Tenderness present. No bony  tenderness. Negative right straight leg raise test and negative left straight leg raise test.     Comments: Left shoulder: Decreased range of motion with overhead flexion, internal rotation, external rotation secondary to pain.  Strength 5/5 bilateral upper extremities.  Hand neurovascularly intact.  Negative empty can and drop arm.  Back: Significant tenderness palpation over left paraspinal muscles.  No spasm or deformity noted.  No pain percussion of vertebrae.  Normal active range of motion of upper and lower  extremities.  Neurological:     Mental Status: He is alert.  Psychiatric:        Behavior: Behavior is cooperative.      UC Treatments / Results  Labs (all labs ordered are listed, but only abnormal results are displayed) Labs Reviewed - No data to display  EKG   Radiology DG Shoulder Left  Result Date: 10/01/2022 CLINICAL DATA:  Decreased range of motion EXAM: LEFT SHOULDER - 2+ VIEW COMPARISON:  None Available. FINDINGS: There is no evidence of fracture or dislocation. There is no evidence of arthropathy or other focal bone abnormality. Soft tissues are unremarkable. IMPRESSION: Negative. Electronically Signed   By: Elige Ko M.D.   On: 10/01/2022 13:22    Procedures Procedures (including critical care time)  Medications Ordered in UC Medications  ondansetron (ZOFRAN-ODT) disintegrating tablet 4 mg (4 mg Oral Given 10/01/22 1335)    Initial Impression / Assessment and Plan / UC Course  I have reviewed the triage vital signs and the nursing notes.  Pertinent labs & imaging results that were available during my care of the patient were reviewed by me and considered in my medical decision making (see chart for details).     X-ray of shoulder obtained given decreased range of motion and significant tenderness showed no osseous abnormality.  Plain films of the spine were deferred as patient had no focal bony tenderness or deformity.  Suspect muscle strain as etiology  of symptoms.  Patient was started on Robaxin up to twice a day with instruction not to drive drink alcohol with taking this medication due to associated drowsiness.  Given upset stomach will defer NSAIDs for the time being but can use acetaminophen/Tylenol for pain relief.  He is to rest and drink plenty of fluid.  Recommended heat, rest, stretch.  Discussed that if his symptoms are not improving quickly he should follow-up with sports medicine; he was given contact information with instruction to call and schedule as needed.  Discussed that if he has any worsening symptoms he is to be seen immediately.  Patient is well-appearing, afebrile, nontoxic, nontachycardic.  No evidence of acute abdomen on physical exam.  Unclear etiology of symptoms but suspect gastroenteritis.  Given focal pain on left we will also cover for diverticulitis with Augmentin.  Patient was given Zofran in clinic with improvement of symptoms.  He was sent home with this prescription with instruction to use it every 8 hours as needed.  He is to eat a bland diet and drink plenty of fluid.  Discussed that if his symptoms are not improving quickly he should follow-up with GI.  He was given contact information with instruction to call to schedule an appointment.  Discussed that if he has any worsening symptoms including severe abdominal pain, fever, nausea, vomiting, melena, hematochezia, hematemesis he needs to go to the emergency room immediately.  Strict return precautions given.  Work excuse note provided.  Final Clinical Impressions(s) / UC Diagnoses   Final diagnoses:  Gastroenteritis  Left sided abdominal pain  Acute left-sided back pain, unspecified back location  Acute pain of left shoulder     Discharge Instructions      Your x-ray was normal with no evidence of any Fracture or dislocation.  I am concerned that you have a muscle strain as etiology of symptoms.  Please start Robaxin twice a day.  This make you sleepy do not  drive or drink alcohol while taking it.  Use heat, stretch, rest for symptom relief.  Follow-up with sports medicine; call to schedule an appointment.  I am glad you are feeling better with the Zofran.  You can continue using this medication every 8 hours as needed.  We are going to start an antibiotic to cover for infection in your intestines given you are having left-sided abdominal pain.  If your symptoms are not improving quickly please follow-up with GI; call to schedule an appointment.  If at any point you have severe abdominal pain, nausea/vomiting interfere with oral intake, weakness, blood in your stool, blood in your vomit you need to go to the emergency room immediately.     ED Prescriptions     Medication Sig Dispense Auth. Provider   ondansetron (ZOFRAN-ODT) 4 MG disintegrating tablet Take 1 tablet (4 mg total) by mouth every 8 (eight) hours as needed for nausea or vomiting. 20 tablet Revanth Neidig K, PA-C   amoxicillin-clavulanate (AUGMENTIN) 875-125 MG tablet Take 1 tablet by mouth every 12 (twelve) hours. 14 tablet Jameila Keeny K, PA-C   methocarbamol (ROBAXIN) 500 MG tablet Take 1 tablet (500 mg total) by mouth 2 (two) times daily. 20 tablet Yurani Fettes, Derry Skill, PA-C      PDMP not reviewed this encounter.   Terrilee Croak, PA-C 10/01/22 1417

## 2022-10-01 NOTE — Discharge Instructions (Addendum)
Your x-ray was normal with no evidence of any Fracture or dislocation.  I am concerned that you have a muscle strain as etiology of symptoms.  Please start Robaxin twice a day.  This make you sleepy do not drive or drink alcohol while taking it.  Use heat, stretch, rest for symptom relief.  Follow-up with sports medicine; call to schedule an appointment.  I am glad you are feeling better with the Zofran.  You can continue using this medication every 8 hours as needed.  We are going to start an antibiotic to cover for infection in your intestines given you are having left-sided abdominal pain.  If your symptoms are not improving quickly please follow-up with GI; call to schedule an appointment.  If at any point you have severe abdominal pain, nausea/vomiting interfere with oral intake, weakness, blood in your stool, blood in your vomit you need to go to the emergency room immediately.

## 2022-10-01 NOTE — ED Triage Notes (Signed)
Pt reports left shoulder pain that goes from neck into lower pain. States this started on Monday.  Also endorses diarrhea x 1 day. States he is unable to eat. Anytime he tries to eat he feels nausea and like he's about to vomit.

## 2022-12-28 ENCOUNTER — Encounter (HOSPITAL_COMMUNITY): Payer: Self-pay

## 2022-12-28 ENCOUNTER — Ambulatory Visit (HOSPITAL_COMMUNITY)
Admission: EM | Admit: 2022-12-28 | Discharge: 2022-12-28 | Disposition: A | Discharge status: Home / Self Care | Payer: Self-pay | Attending: Emergency Medicine | Admitting: Emergency Medicine

## 2022-12-28 DIAGNOSIS — J020 Streptococcal pharyngitis: Secondary | ICD-10-CM

## 2022-12-28 LAB — POCT RAPID STREP A, ED / UC: Streptococcus, Group A Screen (Direct): POSITIVE — AB

## 2022-12-28 MED ORDER — PENICILLIN G BENZATHINE 1200000 UNIT/2ML IM SUSY
1.2000 10*6.[IU] | PREFILLED_SYRINGE | Freq: Once | INTRAMUSCULAR | Status: AC
  Administered 2022-12-28: 1.2 10*6.[IU] via INTRAMUSCULAR

## 2022-12-28 MED ORDER — PENICILLIN G BENZATHINE 1200000 UNIT/2ML IM SUSY
PREFILLED_SYRINGE | INTRAMUSCULAR | Status: AC
  Filled 2022-12-28: qty 2

## 2022-12-28 NOTE — ED Provider Notes (Signed)
[facility]    CSN: [id number] Arrival date & time: 12/28/22  1046    HISTORY   Chief Complaint  Patient presents with   Sore Throat   Headache   Otalgia   Fever   HPI Jonathan Williamson is a pleasant, 31 y.o. male who presents to urgent care today. Patient complains of a 2-day history of sore throat, headache, fever and ear pain.  Patient states his heart clogged.  Patient states he took ibuprofen around 4:30 AM this morning which helped a little bit.  Patient has a mildly elevated temperature on arrival with a heart rate of 103, blood pressure is also elevated.  Patient states he is unable to manage his secretions.  Patient states he is able to maintain his airway.  The history is provided by the patient.   History reviewed. No pertinent past medical history. There are no problems to display for this patient.  History reviewed. No pertinent surgical history.  Home Medications    Prior to Admission medications   Medication Sig Start Date End Date Taking? Authorizing Provider  amoxicillin-clavulanate (AUGMENTIN) 875-125 MG tablet Take 1 tablet by mouth every 12 (twelve) hours. 10/01/22   Raspet, Derry Skill, PA-C  Aspirin-Acetaminophen-Caffeine (GOODY HEADACHE PO) Take 1-2 each by mouth every 8 (eight) hours as needed (for pain).     [provider]  clotrimazole (LOTRIMIN) 1 % cream Apply to affected area 2 times daily Patient not taking: Reported on 08/28/2015 08/21/15   Waynetta Pean, PA-C  ibuprofen (ADVIL,MOTRIN) 200 MG tablet Take 400 mg by mouth every 6 (six) hours as needed for fever, headache, mild pain, moderate pain or cramping.    [provider]  methocarbamol (ROBAXIN) 500 MG tablet Take 1 tablet (500 mg total) by mouth 2 (two) times daily. 10/01/22   Raspet, Erin K, PA-C  ondansetron (ZOFRAN-ODT) 4 MG disintegrating tablet Take 1 tablet (4 mg total) by mouth every 8 (eight) hours as needed for nausea or vomiting. 10/01/22   Raspet, Derry Skill,  PA-C    Family History Family History  Problem Relation Age of Onset   Healthy Mother    Healthy Father    Social History Social History   Tobacco Use   Smoking status: Some Days    Types: Cigarettes   Tobacco comments:    pt vvapes  Vaping Use   Vaping Use: Some days  Substance Use Topics   Alcohol use: Yes    Comment: occasion   Drug use: No   Allergies   Patient has no known allergies.  Review of Systems Review of Systems Pertinent findings revealed after performing a 14 point review of systems has been noted in the history of present illness.  Physical Exam Triage Vital Signs ED Triage Vitals  Enc Vitals Group     BP 10/04/21 0827 (!) 147/82     Pulse Rate 10/04/21 0827 72     Resp 10/04/21 0827 18     Temp 10/04/21 0827 98.3 F (36.8 C)     Temp Source 10/04/21 0827 Oral     SpO2 10/04/21 0827 98 %     Weight --      Height --      Head Circumference --      Peak Flow --      Pain Score 10/04/21 0826 5     Pain Loc --      Pain Edu? --      Excl. in [place]? --  No data found.  Updated Vital Signs BP (!) 159/109 (BP Location: Left Arm)   Pulse (!) 103   Temp 99.3 F (37.4 C) (Oral)   Resp 16   Wt 175 lb (79.4 kg)   SpO2 98%   BMI 25.84 kg/m   Physical Exam Constitutional:      General: He is not in acute distress.    Appearance: He is well-developed. He is ill-appearing. He is not toxic-appearing.  HENT:     Head: Normocephalic and atraumatic.     Salivary Glands: Right salivary gland is diffusely enlarged and tender. Left salivary gland is diffusely enlarged and tender.     Right Ear: Hearing and external ear normal.     Left Ear: Hearing and external ear normal.     Ears:     Comments: Bilateral EACs with mild erythema, bilateral TMs are normal    Nose: No mucosal edema, congestion or rhinorrhea.     Right Turbinates: Not enlarged, swollen or pale.     Left Turbinates: Not enlarged or swollen.     Right Sinus: No maxillary sinus  tenderness or frontal sinus tenderness.     Left Sinus: No maxillary sinus tenderness or frontal sinus tenderness.     Mouth/Throat:     Lips: Pink. No lesions.     Mouth: Mucous membranes are moist. No oral lesions or angioedema.     Dentition: No gingival swelling.     Tongue: No lesions.     Palate: No mass.     Pharynx: Uvula midline. Pharyngeal swelling, oropharyngeal exudate, posterior oropharyngeal erythema and uvula swelling present.     Tonsils: Tonsillar exudate present. 4+ on the right. 4+ on the left.  Eyes:     Extraocular Movements: Extraocular movements intact.     Conjunctiva/sclera: Conjunctivae normal.     Pupils: Pupils are equal, round, and reactive to light.  Neck:     Thyroid: No thyroid mass, thyromegaly or thyroid tenderness.     Trachea: Tracheal tenderness present. No abnormal tracheal secretions or tracheal deviation.     Comments: Voice is muffled Cardiovascular:     Rate and Rhythm: Normal rate and regular rhythm.     Pulses: Normal pulses.     Heart sounds: Normal heart sounds, S1 normal and S2 normal. No murmur heard.    No friction rub. No gallop.  Pulmonary:     Effort: Pulmonary effort is normal. No accessory muscle usage, prolonged expiration, respiratory distress or retractions.     Breath sounds: No stridor, decreased air movement or transmitted upper airway sounds. No decreased breath sounds, wheezing, rhonchi or rales.  Abdominal:     General: Bowel sounds are normal.     Palpations: Abdomen is soft.     Tenderness: There is generalized abdominal tenderness. There is no right CVA tenderness, left CVA tenderness or rebound. Negative signs include Murphy's sign.     Hernia: No hernia is present.  Musculoskeletal:        General: No tenderness. Normal range of motion.     Cervical back: Full passive range of motion without pain, normal range of motion and neck supple.     Right lower leg: No edema.     Left lower leg: No edema.  Lymphadenopathy:      Cervical: Cervical adenopathy present.     Right cervical: Superficial cervical adenopathy present.     Left cervical: Superficial cervical adenopathy present.  Skin:    General: Skin is warm  and dry.     Findings: No erythema, lesion or rash.  Neurological:     General: No focal deficit present.     Mental Status: He is alert and oriented to person, place, and time. Mental status is at baseline.  Psychiatric:        Mood and Affect: Mood normal.        Behavior: Behavior normal.        Thought Content: Thought content normal.        Judgment: Judgment normal.     Visual Acuity Right Eye Distance:   Left Eye Distance:   Bilateral Distance:    Right Eye Near:   Left Eye Near:    Bilateral Near:     UC Couse / Diagnostics / Procedures:     Radiology No results found.  Procedures Procedures (including critical care time) EKG  Pending results:  Labs Reviewed  POCT RAPID STREP A, ED / UC - Abnormal; Notable for the following components:      Result Value   Streptococcus, Group A Screen (Direct) POSITIVE (*)    All other components within normal limits    Medications Ordered in UC: Medications  penicillin g benzathine (BICILLIN LA) 1200000 UNIT/2ML injection 1.2 Million Units (has no administration in time range)    UC Diagnoses / Final Clinical Impressions(s)   I have reviewed the triage vital signs and the nursing notes.  Pertinent labs & imaging results that were available during my care of the patient were reviewed by me and considered in my medical decision making (see chart for details).    Final diagnoses:  Streptococcal pharyngitis   Rapid strep test positive.  Patient received an injection of Bicillin during his visit today.  Return precautions advised. Please see discharge instructions below for further details of plan of care as provided to patient. ED Prescriptions   None    PDMP not reviewed this encounter.  Disposition Upon Discharge:   Condition: stable for discharge home Home: take medications as prescribed; routine discharge instructions as discussed; follow up as advised.  Patient presented with an acute illness with associated systemic symptoms and significant discomfort requiring urgent management. In my opinion, this is a condition that a prudent lay person (someone who possesses an average knowledge of health and medicine) may potentially expect to result in complications if not addressed urgently such as respiratory distress, impairment of bodily function or dysfunction of bodily organs.   Routine symptom specific, illness specific and/or disease specific instructions were discussed with the patient and/or caregiver at length.   As such, the patient has been evaluated and assessed, work-up was performed and treatment was provided in alignment with urgent care protocols and evidence based medicine.  Patient/parent/caregiver has been advised that the patient may require follow up for further testing and treatment if the symptoms continue in spite of treatment, as clinically indicated and appropriate.  If the patient was tested for COVID-19, Influenza and/or RSV, then the patient/parent/guardian was advised to isolate at home pending the results of his/her diagnostic coronavirus test and potentially longer if they're positive. I have also advised pt that if his/her COVID-19 test returns positive, it's recommended to self-isolate for at least 10 days after symptoms first appeared AND until fever-free for 24 hours without fever reducer AND other symptoms have improved or resolved. Discussed self-isolation recommendations as well as instructions for household member/close contacts as per the CDC and [place] DHHS, and also gave patient the COVID packet with this  information.  Patient/parent/caregiver has been advised to return to the [facility] or PCP in 3-5 days if no better; to PCP or the Emergency Department if new signs and symptoms develop,  or if the current signs or symptoms continue to change or worsen for further workup, evaluation and treatment as clinically indicated and appropriate  The patient will follow up with their current PCP if and as advised. If the patient does not currently have a PCP we will assist them in obtaining one.   The patient may need specialty follow up if the symptoms continue, in spite of conservative treatment and management, for further workup, evaluation, consultation and treatment as clinically indicated and appropriate.  Patient/parent/caregiver verbalized understanding and agreement of plan as discussed.  All questions were addressed during visit.  Please see discharge instructions below for further details of plan.  Discharge Instructions:   Discharge Instructions      Your strep test today is positive.  You received an injection of Bicillin today for treatment..   In 24 hours, you will no longer be contagious.  Please discard your toothbrush as well as any other oral devices that you are currently using and replace them with new ones to avoid reinfection.  Please return for repeat evaluation if you are not feeling 100% better after 7 days.  Thank you for visiting urgent care.         This office note has been dictated using Museum/gallery curator.  Unfortunately, this method of dictation can sometimes lead to typographical or grammatical errors.  I apologize for your inconvenience in advance if this occurs.  Please do not hesitate to reach out to me if clarification is needed.      Lynden Oxford Scales, [place] 12/28/22 1237

## 2022-12-28 NOTE — Discharge Instructions (Signed)
Your strep test today is positive.  You received an injection of Bicillin today for treatment..   In 24 hours, you will no longer be contagious.  Please discard your toothbrush as well as any other oral devices that you are currently using and replace them with new ones to avoid reinfection.  Please return for repeat evaluation if you are not feeling 100% better after 7 days.  Thank you for visiting urgent care.

## 2022-12-28 NOTE — ED Triage Notes (Signed)
Pt reports sore throat, headache, fever and ear pain since Friday. Hard to swallow. Took IBU for the pain. Took today at 4:30 am.

## 2023-06-02 ENCOUNTER — Encounter (HOSPITAL_COMMUNITY): Payer: Self-pay

## 2023-06-02 ENCOUNTER — Ambulatory Visit (HOSPITAL_COMMUNITY)
Admission: RE | Admit: 2023-06-02 | Discharge: 2023-06-02 | Disposition: A | Discharge status: Home / Self Care | Payer: Self-pay | Source: Ambulatory Visit | Attending: Family Medicine | Admitting: Family Medicine

## 2023-06-02 ENCOUNTER — Ambulatory Visit (HOSPITAL_COMMUNITY): Payer: Self-pay

## 2023-06-02 VITALS — BP 141/77 | HR 101 | Temp 97.5°F | Resp 18

## 2023-06-02 DIAGNOSIS — N4889 Other specified disorders of penis: Secondary | ICD-10-CM | POA: Insufficient documentation

## 2023-06-02 LAB — POCT URINALYSIS DIP (MANUAL ENTRY)
Bilirubin, UA: NEGATIVE
Blood, UA: NEGATIVE
Glucose, UA: NEGATIVE mg/dL
Ketones, POC UA: NEGATIVE mg/dL
Leukocytes, UA: NEGATIVE
Nitrite, UA: NEGATIVE
Protein Ur, POC: NEGATIVE mg/dL
Spec Grav, UA: <=1.005 — AB (ref 1.010–1.025)
Urobilinogen, UA: 0.2 E.U./dL
pH, UA: 6 (ref 5.0–8.0)

## 2023-06-02 MED ORDER — CEFTRIAXONE SODIUM 500 MG IJ SOLR
INTRAMUSCULAR | Status: AC
  Filled 2023-06-02: qty 500

## 2023-06-02 MED ORDER — LIDOCAINE HCL (PF) 1 % IJ SOLN
INTRAMUSCULAR | Status: AC
  Filled 2023-06-02: qty 2

## 2023-06-02 MED ORDER — CEFTRIAXONE SODIUM 500 MG IJ SOLR
500.0000 mg | INTRAMUSCULAR | Status: DC
  Administered 2023-06-02: 500 mg via INTRAMUSCULAR

## 2023-06-02 MED ORDER — DOXYCYCLINE HYCLATE 100 MG PO CAPS: 100.0000 mg | ORAL_CAPSULE | Freq: Two times a day (BID) | ORAL | 0 refills | Status: DC

## 2023-06-02 NOTE — ED Triage Notes (Signed)
Pt state he has dysuria since Sunday. He states he has no penial discharge, his last sexual encounter was Thursday and it was unprotected.

## 2023-06-02 NOTE — ED Provider Notes (Signed)
[facility]   [id number] 06/02/23 Arrival Time: 1118  ASSESSMENT & PLAN:  1. Penile irritation    U/A without signs of infection. Urethral cytology pending. Prefers empiric tx.    Discharge Instructions      You have been given the following today for treatment of suspected gonorrhea and/or chlamydia:  cefTRIAXone (ROCEPHIN) injection 500 mg  Please pick up your prescription for doxycycline 100 mg and begin taking twice daily for the next seven (7) days.  Even though we have treated you today, we have sent testing for sexually transmitted infections. We will notify you of any positive results once they are received. If required, we will prescribe any medications you might need.  Please refrain from all sexual activity for at least the next seven days.     Meds ordered this encounter  Medications   doxycycline (VIBRAMYCIN) 100 MG capsule    Sig: Take 1 capsule (100 mg total) by mouth 2 (two) times daily.    Dispense:  14 capsule    Refill:  0   cefTRIAXone (ROCEPHIN) injection 500 mg    Order Specific Question:   Antibiotic Indication:    Answer:   STD    Pending: Labs Reviewed  POCT URINALYSIS DIP (MANUAL ENTRY) - Abnormal; Notable for the following components:      Result Value   Spec Grav, UA <=1.005 (*)    All other components within normal limits  CYTOLOGY, (ORAL, ANAL, URETHRAL) ANCILLARY ONLY    Will notify of any positive results. Instructed to refrain from sexual activity for at least seven days.  Reviewed expectations re: course of current medical issues. Questions answered. Outlined signs and symptoms indicating need for more acute intervention. Patient verbalized understanding. After Visit Summary given.   SUBJECTIVE:  Jonathan Williamson is a 32 y.o. male who presents with complaint of penile irritation after unprotected sexual intercourse 5 d ago. Mild dysuria. Denies discharge. Denies fever/abd pain.   OBJECTIVE:  Vitals:    06/02/23 1144  BP: (!) 141/77  Pulse: (!) 101  Resp: 18  Temp: (!) 97.5 F (36.4 C)  TempSrc: Oral  SpO2: 97%    Recheck P: 94 General appearance: alert, cooperative GU: deferred Skin: warm and dry Psychological: alert and cooperative; normal mood and affect.  Results for orders placed or performed during the hospital encounter of 06/02/23  POC urinalysis dipstick  Result Value Ref Range   Color, UA yellow yellow   Clarity, UA clear clear   Glucose, UA negative negative mg/dL   Bilirubin, UA negative negative   Ketones, POC UA negative negative mg/dL   Spec Grav, UA <=0.865 (A) 1.010 - 1.025   Blood, UA negative negative   pH, UA 6.0 5.0 - 8.0   Protein Ur, POC negative negative mg/dL   Urobilinogen, UA 0.2 0.2 or 1.0 E.U./dL   Nitrite, UA Negative Negative   Leukocytes, UA Negative Negative    Labs Reviewed  POCT URINALYSIS DIP (MANUAL ENTRY) - Abnormal; Notable for the following components:      Result Value   Spec Grav, UA <=1.005 (*)    All other components within normal limits  CYTOLOGY, (ORAL, ANAL, URETHRAL) ANCILLARY ONLY    No Known Allergies  History reviewed. No pertinent past medical history. Family History  Problem Relation Age of Onset   Healthy Mother    Healthy Father    Social History   Socioeconomic History   Marital status: Single    Spouse name: Not  on file   Number of children: Not on file   Years of education: Not on file   Highest education level: Not on file  Occupational History   Not on file  Tobacco Use   Smoking status: Some Days    Types: Cigarettes   Smokeless tobacco: Not on file   Tobacco comments:    pt vvapes  Vaping Use   Vaping Use: Some days  Substance and Sexual Activity   Alcohol use: Yes    Comment: occasion   Drug use: No   Sexual activity: Yes  Other Topics Concern   Not on file  Social History Narrative   Not on file   Social Determinants of Health   Financial Resource Strain: Not on file  Food  Insecurity: Not on file  Transportation Needs: Not on file  Physical Activity: Not on file  Stress: Not on file  Social Connections: Not on file  Intimate Partner Violence: Not on file           Mardella Layman, MD 06/02/23 1236

## 2023-06-02 NOTE — Discharge Instructions (Signed)

## 2023-06-05 LAB — CYTOLOGY, (ORAL, ANAL, URETHRAL) ANCILLARY ONLY
Chlamydia: NEGATIVE
Comment: NEGATIVE
Comment: NEGATIVE
Comment: NORMAL
Neisseria Gonorrhea: NEGATIVE
Trichomonas: NEGATIVE

## 2023-06-30 ENCOUNTER — Emergency Department (HOSPITAL_COMMUNITY): Payer: Self-pay

## 2023-06-30 ENCOUNTER — Emergency Department (HOSPITAL_COMMUNITY)
Admission: EM | Admit: 2023-06-30 | Discharge: 2023-06-30 | Disposition: A | Discharge status: Home / Self Care | Payer: Self-pay | Attending: Emergency Medicine | Admitting: Emergency Medicine

## 2023-06-30 ENCOUNTER — Ambulatory Visit (HOSPITAL_COMMUNITY)
Admission: RE | Admit: 2023-06-30 | Discharge: 2023-06-30 | Disposition: A | Discharge status: Home / Self Care | Payer: Self-pay | Source: Ambulatory Visit | Attending: Family Medicine | Admitting: Family Medicine

## 2023-06-30 ENCOUNTER — Other Ambulatory Visit: Payer: Self-pay

## 2023-06-30 ENCOUNTER — Encounter (HOSPITAL_COMMUNITY): Payer: Self-pay

## 2023-06-30 ENCOUNTER — Encounter (HOSPITAL_COMMUNITY): Payer: Self-pay | Admitting: Emergency Medicine

## 2023-06-30 VITALS — BP 160/110 | HR 93 | Temp 98.1°F | Resp 16

## 2023-06-30 DIAGNOSIS — Z1152 Encounter for screening for COVID-19: Secondary | ICD-10-CM | POA: Insufficient documentation

## 2023-06-30 DIAGNOSIS — R1084 Generalized abdominal pain: Secondary | ICD-10-CM | POA: Insufficient documentation

## 2023-06-30 DIAGNOSIS — R197 Diarrhea, unspecified: Secondary | ICD-10-CM

## 2023-06-30 DIAGNOSIS — Z7982 Long term (current) use of aspirin: Secondary | ICD-10-CM | POA: Insufficient documentation

## 2023-06-30 DIAGNOSIS — R110 Nausea: Secondary | ICD-10-CM

## 2023-06-30 LAB — POCT URINALYSIS DIP (MANUAL ENTRY)
Bilirubin, UA: NEGATIVE
Blood, UA: NEGATIVE
Glucose, UA: NEGATIVE mg/dL
Ketones, POC UA: NEGATIVE mg/dL
Leukocytes, UA: NEGATIVE
Nitrite, UA: NEGATIVE
Spec Grav, UA: 1.02 (ref 1.010–1.025)
Urobilinogen, UA: 1 E.U./dL
pH, UA: 8.5 — AB (ref 5.0–8.0)

## 2023-06-30 LAB — COMPREHENSIVE METABOLIC PANEL
ALT: 31 U/L (ref 0–44)
AST: 25 U/L (ref 15–41)
Albumin: 3.6 g/dL (ref 3.5–5.0)
Alkaline Phosphatase: 47 U/L (ref 38–126)
Anion gap: 8 (ref 5–15)
BUN: 9 mg/dL (ref 6–20)
CO2: 24 mmol/L (ref 22–32)
Calcium: 9.2 mg/dL (ref 8.9–10.3)
Chloride: 106 mmol/L (ref 98–111)
Creatinine, Ser: 1.14 mg/dL (ref 0.61–1.24)
GFR, Estimated: >60 mL/min (ref >60)
Glucose, Bld: 93 mg/dL (ref 70–99)
Potassium: 4 mmol/L (ref 3.5–5.1)
Sodium: 138 mmol/L (ref 135–145)
Total Bilirubin: 0.4 mg/dL (ref 0.3–1.2)
Total Protein: 7 g/dL (ref 6.5–8.1)

## 2023-06-30 LAB — CBC
HCT: 40 % (ref 39.0–52.0)
Hemoglobin: 13.8 g/dL (ref 13.0–17.0)
MCH: 30.5 pg (ref 26.0–34.0)
MCHC: 34.5 g/dL (ref 30.0–36.0)
MCV: 88.3 fL (ref 80.0–100.0)
Platelets: 268 10*3/uL (ref 150–400)
RBC: 4.53 MIL/uL (ref 4.22–5.81)
RDW: 12.9 % (ref 11.5–15.5)
WBC: 4.5 10*3/uL (ref 4.0–10.5)
nRBC: 0 % (ref 0.0–0.2)

## 2023-06-30 LAB — URINALYSIS, ROUTINE W REFLEX MICROSCOPIC
Bilirubin Urine: NEGATIVE
Glucose, UA: NEGATIVE mg/dL
Hgb urine dipstick: NEGATIVE
Ketones, ur: NEGATIVE mg/dL
Leukocytes,Ua: NEGATIVE
Nitrite: NEGATIVE
Protein, ur: NEGATIVE mg/dL
Specific Gravity, Urine: 1.021 (ref 1.005–1.030)
pH: 7 (ref 5.0–8.0)

## 2023-06-30 LAB — LIPASE, BLOOD: Lipase: 43 U/L (ref 11–51)

## 2023-06-30 LAB — SARS CORONAVIRUS 2 BY RT PCR: SARS Coronavirus 2 by RT PCR: NEGATIVE

## 2023-06-30 MED ORDER — ONDANSETRON HCL 4 MG/2ML IJ SOLN
4.0000 mg | Freq: Once | INTRAMUSCULAR | Status: AC
  Administered 2023-06-30: 4 mg via INTRAVENOUS
  Filled 2023-06-30: qty 2

## 2023-06-30 MED ORDER — ONDANSETRON 4 MG PO TBDP
4.0000 mg | ORAL_TABLET | Freq: Once | ORAL | Status: AC
  Administered 2023-06-30: 4 mg via ORAL

## 2023-06-30 MED ORDER — ONDANSETRON 4 MG PO TBDP
ORAL_TABLET | ORAL | Status: AC
  Filled 2023-06-30: qty 1

## 2023-06-30 MED ORDER — IOHEXOL 350 MG/ML SOLN
75.0000 mL | Freq: Once | INTRAVENOUS | Status: AC | PRN
  Administered 2023-06-30: 75 mL via INTRAVENOUS

## 2023-06-30 MED ORDER — MORPHINE SULFATE (PF) 4 MG/ML IV SOLN
4.0000 mg | Freq: Once | INTRAVENOUS | Status: AC
  Administered 2023-06-30: 4 mg via INTRAVENOUS
  Filled 2023-06-30: qty 1

## 2023-06-30 MED ORDER — LACTATED RINGERS IV BOLUS
1000.0000 mL | Freq: Once | INTRAVENOUS | Status: AC
  Administered 2023-06-30: 1000 mL via INTRAVENOUS

## 2023-06-30 MED ORDER — ONDANSETRON 4 MG PO TBDP: 4.0000 mg | ORAL_TABLET | Freq: Three times a day (TID) | ORAL | 0 refills | Status: DC | PRN

## 2023-06-30 MED ORDER — DICYCLOMINE HCL 20 MG PO TABS: 20.0000 mg | ORAL_TABLET | Freq: Two times a day (BID) | ORAL | 0 refills | Status: DC

## 2023-06-30 NOTE — ED Triage Notes (Signed)
Pt presents to the office for lower abdominal pain and pressure. Pt states he has watery diarrhea. Pt reports nausea when he is up and moving around.

## 2023-06-30 NOTE — ED Provider Notes (Signed)
[place] EMERGENCY DEPARTMENT AT [facility] Provider Note   CSN: [id number] Arrival date & time: 06/30/23  1203     History  Chief Complaint  Patient presents with   Abdominal Pain    Jonathan Williamson is a 32 y.o. male.  Patient presents with concern for generalized abdominal pain.  Ongoing since Saturday.  Associated with some URI symptoms.  Was seen at urgent care and referred to the emergency department to rule out appendicitis.  Denies history of abdominal surgeries.  The history is provided by the patient. No language interpreter was used.       Home Medications Prior to Admission medications   Medication Sig Start Date End Date Taking? Authorizing Provider  Aspirin-Acetaminophen-Caffeine (GOODY HEADACHE PO) Take 1-2 each by mouth every 8 (eight) hours as needed (for pain).     [provider]  clotrimazole (LOTRIMIN) 1 % cream Apply to affected area 2 times daily Patient not taking: Reported on 08/28/2015 08/21/15   Everlene Farrier, PA-C  doxycycline (VIBRAMYCIN) 100 MG capsule Take 1 capsule (100 mg total) by mouth 2 (two) times daily. 06/02/23   Mardella Layman, MD  ibuprofen (ADVIL,MOTRIN) 200 MG tablet Take 400 mg by mouth every 6 (six) hours as needed for fever, headache, mild pain, moderate pain or cramping.    [provider]  methocarbamol (ROBAXIN) 500 MG tablet Take 1 tablet (500 mg total) by mouth 2 (two) times daily. 10/01/22   Raspet, Erin K, PA-C  ondansetron (ZOFRAN-ODT) 4 MG disintegrating tablet Take 1 tablet (4 mg total) by mouth every 8 (eight) hours as needed for nausea or vomiting. 10/01/22   Raspet, Noberto Retort, PA-C      Allergies    Patient has no known allergies.    Review of Systems   Review of Systems  Constitutional:  Negative for fever.  Respiratory:  Negative for shortness of breath.   Gastrointestinal:  Positive for abdominal pain and nausea. Negative for vomiting.  Genitourinary:  Negative for dysuria.  All other  systems reviewed and are negative.   Physical Exam Updated Vital Signs BP (!) 138/101 (BP Location: Right Arm)   Pulse 69   Temp 98.7 F (37.1 C) (Oral)   Resp 14   Ht 5\' 8"  (1.727 m)   Wt 84.8 kg   SpO2 100%   BMI 28.43 kg/m  Physical Exam Vitals and nursing note reviewed.  Constitutional:      General: He is not in acute distress.    Appearance: Normal appearance. He is not ill-appearing.  HENT:     Head: Normocephalic and atraumatic.     Nose: Nose normal.  Eyes:     General: No scleral icterus.    Extraocular Movements: Extraocular movements intact.     Conjunctiva/sclera: Conjunctivae normal.  Cardiovascular:     Rate and Rhythm: Normal rate and regular rhythm.     Heart sounds: Normal heart sounds.  Pulmonary:     Effort: Pulmonary effort is normal. No respiratory distress.     Breath sounds: Normal breath sounds. No wheezing or rales.  Abdominal:     General: There is no distension.     Tenderness: There is abdominal tenderness. There is guarding.  Musculoskeletal:        General: Normal range of motion.     Cervical back: Normal range of motion.  Skin:    General: Skin is warm and dry.  Neurological:     General: No focal deficit present.  Mental Status: He is alert. Mental status is at baseline.     ED Results / Procedures / Treatments   Labs (all labs ordered are listed, but only abnormal results are displayed) Labs Reviewed  SARS CORONAVIRUS 2 BY RT PCR  LIPASE, BLOOD  COMPREHENSIVE METABOLIC PANEL  CBC  URINALYSIS, ROUTINE W REFLEX MICROSCOPIC    EKG None  Radiology No results found.  Procedures Procedures    Medications Ordered in ED Medications  ondansetron (ZOFRAN) injection 4 mg (has no administration in time range)  morphine (PF) 4 MG/ML injection 4 mg (has no administration in time range)  lactated ringers bolus 1,000 mL (has no administration in time range)    ED Course/ Medical Decision Making/ A&P                              Medical Decision Making Amount and/or Complexity of Data Reviewed Labs: ordered. Radiology: ordered.  Risk Prescription drug management.   32 year old male presents today for evaluation of abdominal pain.  Has generalized abdominal tenderness and guarding on exam.  Symptoms ongoing for a few days.  Afebrile.  Will obtain blood work, provide pain control, fluid bolus, and obtain CT abdomen pelvis with contrast.  CBC, CMP unremarkable.  UA unremarkable.  Lipase within normal limit.  COVID-negative.  CT abdomen pelvis with contrast pending at the end of my shift.  Signed out to oncoming provider.   Final Clinical Impression(s) / ED Diagnoses Final diagnoses:  Generalized abdominal pain    Rx / DC Orders ED Discharge Orders          Ordered    dicyclomine (BENTYL) 20 MG tablet  2 times daily        06/30/23 1506    ondansetron (ZOFRAN-ODT) 4 MG disintegrating tablet  Every 8 hours PRN        06/30/23 1506              Marita [place], PA-C 06/30/23 1507    Arby Barrette, MD 07/03/23 [phone]

## 2023-06-30 NOTE — ED Provider Notes (Signed)
  Physical Exam  BP 132/85   Pulse 63   Temp 98.4 F (36.9 C) (Oral)   Resp 17   Ht 5\' 8"  (1.727 m)   Wt 84.8 kg   SpO2 100%   BMI 28.43 kg/m   Physical Exam  Procedures  Procedures  ED Course / MDM    Medical Decision Making Amount and/or Complexity of Data Reviewed Labs: ordered. Radiology: ordered.  Risk Prescription drug management.   Patient care assumed from Amjad A.  PA at shift change, please see his note for full HPI.  Briefly, patient here with nausea, vomiting, generalized abdominal tenderness for the past 3 days.  Plan is for CT abdomen and pelvis, p.o. trial and likely home.  Will have medications ordered by prior provider.  CT Abdomen and pelvis showed: IMPRESSION:  No significant abnormality seen in the abdomen or pelvis.   These results were discussed with patient, he was also provided with a copy of her CT abdomen and pelvis.  P.o. trial completed prior to discharge.  Patient is hemodynamically stable for discharge.  Portions of this note were generated with Scientist, clinical (histocompatibility and immunogenetics). Dictation errors may occur despite best attempts at proofreading.      Claude Manges, PA-C 06/30/23 1648    Rondel Baton, MD 07/01/23 1230

## 2023-06-30 NOTE — Discharge Instructions (Signed)
Your workup today is reassuring.  No concerning cause of your symptoms on blood work or CT.  This is likely viral.  I sent Bentyl and Zofran into the pharmacy for symptom management.  For any concerning symptoms return to the emergency room.  If you do not have a primary care provider [facility].

## 2023-06-30 NOTE — ED Notes (Signed)
Patient is being discharged from the Urgent Care and sent to the Emergency Department via POV . Per Dr. Tracie Harrier, patient is in need of higher level of care due to abdominal pain. Patient is aware and verbalizes understanding of plan of care.

## 2023-06-30 NOTE — ED Provider Notes (Signed)
[facility]   [id number] 06/30/23 Arrival Time: 1020  ASSESSMENT & PLAN:  1. Generalized abdominal pain   2. Nausea without vomiting   3. Diarrhea, unspecified type    Symptoms sound more viral but he is quite TTP over lower abdomen, esp RLQ. Looks uncomfortable. Cannot r/o acute appendicitis here. Recommend ED evaluation. D/C to ED by POV; stable upon discharge.  Meds ordered this encounter  Medications   ondansetron (ZOFRAN-ODT) disintegrating tablet 4 mg   Results for orders placed or performed during the hospital encounter of 06/30/23  POC urinalysis dipstick  Result Value Ref Range   Color, UA yellow yellow   Clarity, UA clear clear   Glucose, UA negative negative mg/dL   Bilirubin, UA negative negative   Ketones, POC UA negative negative mg/dL   Spec Grav, UA 4.098 1.191 - 1.025   Blood, UA negative negative   pH, UA 8.5 (A) 5.0 - 8.0   Protein Ur, POC trace (A) negative mg/dL   Urobilinogen, UA 1.0 0.2 or 1.0 E.U./dL   Nitrite, UA Negative Negative   Leukocytes, UA Negative Negative    Follow-up Information     Go to  [facility] Emergency Department at [facility].   Specialty: Emergency Medicine Contact information: [address] [place] [address] [phone]                Reviewed expectations re: course of current medical issues. Questions answered. Outlined signs and symptoms indicating need for more acute intervention. Patient verbalized understanding. After Visit Summary given.   SUBJECTIVE: History from: patient. Jonathan Williamson is a 32 y.o. male who presents with complaint of generalized non-radiating abdominal pain; gradual onset approx 48 hours ago; steadily worsening. Abd pain started first followed by sporadic non-bloody loose stools. Pain now affecting sleep and is persistent. With nausea over past day; denies emesis. No appetite. Overall decreased PO intake. No tx PTA. Denies urinary  symptoms.  History reviewed. No pertinent surgical history.  OBJECTIVE:  Vitals:   06/30/23 1047  BP: (!) 160/110  Pulse: 93  Resp: 16  Temp: 98.1 F (36.7 C)  TempSrc: Oral  SpO2: 93%    General appearance: alert and oriented; appears uncomfortable; moves slowly and carefully when ambulating to exam table HEENT: [place]; AT; oropharynx moist Lungs: unlabored respirations Abdomen: soft; without distention; with generalized TTP but much more TTP over RLQ; normal bowel sounds; without masses or organomegaly; without guarding or rebound tenderness (does voluntarily guard) Back: without CVA tenderness; FROM at waist Extremities: without LE edema; symmetrical; without gross deformities Skin: warm and dry Neurologic: normal gait Psychological: alert and cooperative; normal mood and affect  Labs: Results for orders placed or performed during the hospital encounter of 06/30/23  POC urinalysis dipstick  Result Value Ref Range   Color, UA yellow yellow   Clarity, UA clear clear   Glucose, UA negative negative mg/dL   Bilirubin, UA negative negative   Ketones, POC UA negative negative mg/dL   Spec Grav, UA 8.469 6.295 - 1.025   Blood, UA negative negative   pH, UA 8.5 (A) 5.0 - 8.0   Protein Ur, POC trace (A) negative mg/dL   Urobilinogen, UA 1.0 0.2 or 1.0 E.U./dL   Nitrite, UA Negative Negative   Leukocytes, UA Negative Negative   Labs Reviewed  POCT URINALYSIS DIP (MANUAL ENTRY) - Abnormal; Notable for the following components:      Result Value   pH, UA 8.5 (*)  Protein Ur, POC trace (*)    All other components within normal limits    No Known Allergies                                             History reviewed. No pertinent past medical history.  Social History   Socioeconomic History   Marital status: Single    Spouse name: Not on file   Number of children: Not on file   Years of education: Not on file   Highest education level: Not on file  Occupational  History   Not on file  Tobacco Use   Smoking status: Some Days    Types: Cigarettes   Smokeless tobacco: Not on file   Tobacco comments:    pt vvapes  Vaping Use   Vaping status: Some Days  Substance and Sexual Activity   Alcohol use: Yes    Comment: occasion   Drug use: No   Sexual activity: Yes  Other Topics Concern   Not on file  Social History Narrative   Not on file   Social Determinants of Health   Financial Resource Strain: Not on file  Food Insecurity: Not on file  Transportation Needs: Not on file  Physical Activity: Not on file  Stress: Not on file  Social Connections: Unknown (12/28/2022)   Received from [facility]   Social Network    Social Network: Not on file  Intimate Partner Violence: Unknown (12/28/2022)   Received from [facility]   HITS    Physically Hurt: Not on file    Insult or Talk Down To: Not on file    Threaten Physical Harm: Not on file    Scream or Curse: Not on file    Family History  Problem Relation Age of Onset   Healthy Mother    Healthy Father      Mardella Layman, MD 06/30/23 1152

## 2023-06-30 NOTE — ED Triage Notes (Signed)
Patient arrives ambulatory by POV c/o generalized abdominal pain/ pressure along with nausea, headache and body aches since Saturday evening.

## 2023-11-10 ENCOUNTER — Ambulatory Visit (INDEPENDENT_AMBULATORY_CARE_PROVIDER_SITE_OTHER): Payer: Self-pay

## 2023-11-10 ENCOUNTER — Encounter (HOSPITAL_COMMUNITY): Payer: Self-pay

## 2023-11-10 ENCOUNTER — Ambulatory Visit (HOSPITAL_COMMUNITY)
Admission: EM | Admit: 2023-11-10 | Discharge: 2023-11-10 | Disposition: A | Discharge status: Home / Self Care | Payer: Self-pay | Attending: Family Medicine | Admitting: Family Medicine

## 2023-11-10 DIAGNOSIS — M25571 Pain in right ankle and joints of right foot: Secondary | ICD-10-CM

## 2023-11-10 DIAGNOSIS — M79671 Pain in right foot: Secondary | ICD-10-CM

## 2023-11-10 MED ORDER — IBUPROFEN 800 MG PO TABS: 800.0000 mg | ORAL_TABLET | Freq: Three times a day (TID) | ORAL | 0 refills | Status: DC | PRN

## 2023-11-10 NOTE — ED Provider Notes (Signed)
[facility]    CSN: [id number] Arrival date & time: 11/10/23  1344      History   Chief Complaint Chief Complaint  Patient presents with   Ankle Pain    HPI Jonathan Williamson is a 32 y.o. male.    Ankle Pain Here for ankle pain and anterior foot pain.  On November 27 he turned his right ankle and felt a pop.  It was hurting along the lateral side and then improved.  But then on November 30 he went to a trampoline park and noticed that his right ankle was starting to hurt more.  Also he fell and hurt his first right MTP when he fell.  No allergies to medications  History reviewed. No pertinent past medical history.  There are no problems to display for this patient.   History reviewed. No pertinent surgical history.     Home Medications    Prior to Admission medications   Medication Sig Start Date End Date Taking? Authorizing Provider  ibuprofen (ADVIL) 800 MG tablet Take 1 tablet (800 mg total) by mouth every 8 (eight) hours as needed (pain). 11/10/23  Yes Jolene Guyett, Janace Aris, MD    Family History Family History  Problem Relation Age of Onset   Healthy Mother    Healthy Father     Social History Social History   Tobacco Use   Smoking status: Some Days    Types: Cigarettes   Tobacco comments:    pt vvapes  Vaping Use   Vaping status: Some Days  Substance Use Topics   Alcohol use: Yes    Comment: occasion   Drug use: No     Allergies   Patient has no known allergies.   Review of Systems Review of Systems   Physical Exam Triage Vital Signs ED Triage Vitals [11/10/23 1522]  Encounter Vitals Group     BP (!) 163/100     Systolic BP Percentile      Diastolic BP Percentile      Pulse Rate 78     Resp 16     Temp 98.2 F (36.8 C)     Temp Source Oral     SpO2 98 %     Weight      Height      Head Circumference      Peak Flow      Pain Score 7     Pain Loc      Pain Education      Exclude from Growth Chart    No data  found.  Updated Vital Signs BP (!) 163/100 (BP Location: Right Arm)   Pulse 78   Temp 98.2 F (36.8 C) (Oral)   Resp 16   SpO2 98%   Visual Acuity Right Eye Distance:   Left Eye Distance:   Bilateral Distance:    Right Eye Near:   Left Eye Near:    Bilateral Near:     Physical Exam Vitals reviewed.  Constitutional:      General: He is not in acute distress.    Appearance: He is not ill-appearing, toxic-appearing or diaphoretic.  Musculoskeletal:     Comments: There is some mild tenderness posterior to the right lateral malleolus.  He is also tender over the first MTP but it is not swollen or red.  Pulses are normal  Skin:    Coloration: Skin is not jaundiced or pale.  Neurological:     Mental Status: He is alert.  UC Treatments / Results  Labs (all labs ordered are listed, but only abnormal results are displayed) Labs Reviewed - No data to display  EKG   Radiology DG Foot Complete Right  Result Date: 11/10/2023 CLINICAL DATA:  right first MTP and right ankle pain EXAM: RIGHT FOOT COMPLETE - 3+ VIEW COMPARISON:  None Available. FINDINGS: There is no evidence of fracture or dislocation. There is no evidence of arthropathy or other focal bone abnormality. Soft tissues are unremarkable. IMPRESSION: Negative. Electronically Signed   By: Tish Frederickson M.D.   On: 11/10/2023 17:14   DG Ankle Complete Right  Result Date: 11/10/2023 CLINICAL DATA:  Right ankle pain post fall twisting injury EXAM: RIGHT ANKLE - COMPLETE 3+ VIEW COMPARISON:  None Available. FINDINGS: There is no evidence of fracture, dislocation, or joint effusion. There is no evidence of arthropathy or other focal bone abnormality. Soft tissues are unremarkable. Probable bone island in the distal tibia IMPRESSION: Negative. Electronically Signed   By: Jasmine Pang M.D.   On: 11/10/2023 16:30    Procedures Procedures (including critical care time)  Medications Ordered in UC Medications - No data to  display  Initial Impression / Assessment and Plan / UC Course  I have reviewed the triage vital signs and the nursing notes.  Pertinent labs & imaging results that were available during my care of the patient were reviewed by me and considered in my medical decision making (see chart for details).   By my review there is no fracture on either x-ray.  By the time I am dictating this chart, radiology reports are available and are negative for acute fracture.  Ace wrap is supplied and ibuprofen is sent to the pharmacy.   Final Clinical Impressions(s) / UC Diagnoses   Final diagnoses:  Acute right ankle pain  Right foot pain     Discharge Instructions      By my review the x-rays of your foot and ankle are normal and do not show any broken bones. The radiologist will also read your x-ray, and if their interpretation differs significantly from mine, we will call you.  Take ibuprofen 800 mg--1 tab every 8 hours as needed for pain.      ED Prescriptions     Medication Sig Dispense Auth. Provider   ibuprofen (ADVIL) 800 MG tablet Take 1 tablet (800 mg total) by mouth every 8 (eight) hours as needed (pain). 21 tablet Lether Tesch, Janace Aris, MD      PDMP not reviewed this encounter.   Zenia Resides, MD 11/10/23 2045

## 2023-11-10 NOTE — ED Triage Notes (Signed)
Pt states twisted his rt ankle on last Wednesday and felt a pop. States went to trampoline part on Saturday and hit his rt great toe. C/o knot to great toe and ankle is worse. Took tylenol with no relief.

## 2023-11-10 NOTE — Discharge Instructions (Signed)
By my review the x-rays of your foot and ankle are normal and do not show any broken bones. The radiologist will also read your x-ray, and if their interpretation differs significantly from mine, we will call you.  Take ibuprofen 800 mg--1 tab every 8 hours as needed for pain.

## 2024-03-10 ENCOUNTER — Ambulatory Visit
Admission: EM | Admit: 2024-03-10 | Discharge: 2024-03-10 | Disposition: A | Discharge status: Home / Self Care | Payer: Self-pay | Attending: Internal Medicine | Admitting: Internal Medicine

## 2024-03-10 ENCOUNTER — Encounter: Payer: Self-pay | Admitting: Emergency Medicine

## 2024-03-10 DIAGNOSIS — S8980XA Other specified injuries of unspecified lower leg, initial encounter: Secondary | ICD-10-CM

## 2024-03-10 DIAGNOSIS — A084 Viral intestinal infection, unspecified: Secondary | ICD-10-CM

## 2024-03-10 DIAGNOSIS — M76891 Other specified enthesopathies of right lower limb, excluding foot: Secondary | ICD-10-CM

## 2024-03-10 MED ORDER — KETOROLAC TROMETHAMINE 30 MG/ML IJ SOLN
30.0000 mg | Freq: Once | INTRAMUSCULAR | Status: AC
  Administered 2024-03-10: 30 mg via INTRAMUSCULAR

## 2024-03-10 MED ORDER — ONDANSETRON 4 MG PO TBDP: 4.0000 mg | ORAL_TABLET | Freq: Three times a day (TID) | ORAL | 0 refills | Status: DC | PRN

## 2024-03-10 MED ORDER — NAPROXEN 500 MG PO TABS: 500.0000 mg | ORAL_TABLET | Freq: Two times a day (BID) | ORAL | 0 refills | Status: AC

## 2024-03-10 MED ORDER — ONDANSETRON 4 MG PO TBDP
4.0000 mg | ORAL_TABLET | Freq: Once | ORAL | Status: AC
  Administered 2024-03-10: 4 mg via ORAL

## 2024-03-10 NOTE — ED Triage Notes (Signed)
 Pt c/o  fluid feeling in both knees, and had a sharp pain in right knee yesterday and has been painful since.  He also c/o nausea, abdominal,  and diarrhea since yesterday. States his daughter has stomach bug.

## 2024-03-10 NOTE — ED Provider Notes (Signed)
 [facility]    CSN: [id number] Arrival date & time: 03/10/24  1024      History   Chief Complaint Chief Complaint  Patient presents with   Knee Pain   Abdominal Pain    HPI Jonathan Williamson is a 33 y.o. male.   Jonathan Williamson is a 33 y.o. male presenting for chief complaint of right Knee Pain that started when he jumped up onto the side of a garbage truck 2 days ago with the right leg causing immediate pain to the right knee that radiated to the "whole leg". He was able to get down from the garbage truck after injury and "walked with a limp" the rest of the day. 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 He has chronic pain to the bilateral knees due to work requirements and jumping movements. He works 5 12 hour shifts per week. Pain to the knee is localized the generalized anterior right knee and is triggered by weight bearing activity/walking. No previous injuries to the knees. Denies paresthesias distally to the lower legs/feet, unilateral weakness. Denies previous broken bones/surgeries to the knees. He is taking tylenol without much relief of pain.   Additionally reports nausea without vomiting and diarrhea that started this morning. His daughter is currently sick with the "stomach bug" and he is concerned he may have the same. He is very nauseous and is afraid to eat as he is concerned he will vomit. Denies blood/mucous in diarrhea. No fevers, chills, dizziness, cough, nasal congestion, CP, shortness of breath, abdominal pain, and rash. No other recent sick contacts with similar symptoms. He has not attempted treatment of symptoms.    Knee Pain Abdominal Pain   History reviewed. No pertinent past medical history.  There are no active problems to display for this patient.   History reviewed. No pertinent surgical history.     Home Medications    Prior to Admission medications   Medication Sig  Start Date End Date Taking? Authorizing Provider  naproxen (NAPROSYN) 500 MG tablet Take 1 tablet (500 mg total) by mouth 2 (two) times daily. 03/10/24  Yes Carlisle Beers, FNP  ondansetron (ZOFRAN-ODT) 4 MG disintegrating tablet Take 1 tablet (4 mg total) by mouth every 8 (eight) hours as needed for nausea or vomiting. 03/10/24  Yes Kensleigh Gates, Donavan Burnet, FNP    Family History Family History  Problem Relation Age of Onset   Healthy Mother    Healthy Father     Social History Social History   Tobacco Use   Smoking status: Some Days    Types: Cigarettes   Tobacco comments:    pt vvapes  Vaping Use   Vaping status: Some Days  Substance Use Topics   Alcohol use: Yes    Comment: occasion   Drug use: No     Allergies   Patient has no known allergies.   Review of Systems Review of Systems  Gastrointestinal:  Positive for abdominal pain.  Per HPI   Physical Exam Triage Vital Signs ED Triage Vitals  Encounter Vitals Group     BP 03/10/24 1105 (!) 154/88     Systolic BP Percentile --      Diastolic BP Percentile --      Pulse Rate 03/10/24 1105 96     Resp 03/10/24 1105 16     Temp 03/10/24 1105 97.6 F (36.4 C)  Temp Source 03/10/24 1105 Oral     SpO2 03/10/24 1105 98 %     Weight --      Height --      Head Circumference --      Peak Flow --      Pain Score 03/10/24 1108 7     Pain Loc --      Pain Education --      Exclude from Growth Chart --    No data found.  Updated Vital Signs BP (!) 154/88 (BP Location: Right Arm)   Pulse 96   Temp 97.6 F (36.4 C) (Oral)   Resp 16   SpO2 98%   Visual Acuity Right Eye Distance:   Left Eye Distance:   Bilateral Distance:    Right Eye Near:   Left Eye Near:    Bilateral Near:     Physical Exam Vitals and nursing note reviewed.  Constitutional:      Appearance: He is not ill-appearing or toxic-appearing.  HENT:     Head: Normocephalic and atraumatic.     Jaw: There is normal jaw occlusion.      Right Ear: Hearing and external ear normal.     Left Ear: Hearing and external ear normal.     Nose: Nose normal.     Mouth/Throat:     Lips: Pink.  Eyes:     General: Lids are normal. Vision grossly intact. Gaze aligned appropriately.     Extraocular Movements: Extraocular movements intact.     Conjunctiva/sclera: Conjunctivae normal.  Cardiovascular:     Rate and Rhythm: Normal rate and regular rhythm.     Heart sounds: Normal heart sounds, S1 normal and S2 normal.  Pulmonary:     Effort: Pulmonary effort is normal. No respiratory distress.     Breath sounds: Normal breath sounds and air entry.  Abdominal:     General: Abdomen is flat. Bowel sounds are normal.     Palpations: Abdomen is soft.     Tenderness: There is no abdominal tenderness. There is no right CVA tenderness, left CVA tenderness or guarding.  Musculoskeletal:     Cervical back: Neck supple.     Right upper leg: Normal.     Right knee: Bony tenderness present. No swelling, deformity, effusion, erythema, ecchymosis, lacerations or crepitus. Normal range of motion. Tenderness present over the medial joint line and lateral joint line. No LCL laxity or MCL laxity. Normal alignment, normal meniscus and normal patellar mobility. Normal pulse.     Instability Tests: Anterior drawer test negative.     Right lower leg: Normal.     Comments: Tenderness to the right knee elicited with extension, full ROM.   Skin:    General: Skin is warm and dry.     Capillary Refill: Capillary refill takes less than 2 seconds.     Findings: No rash.  Neurological:     General: No focal deficit present.     Mental Status: He is alert and oriented to person, place, and time. Mental status is at baseline.     Cranial Nerves: No dysarthria or facial asymmetry.  Psychiatric:        Mood and Affect: Mood normal.        Speech: Speech normal.        Behavior: Behavior normal.        Thought Content: Thought content normal.        Judgment:  Judgment normal.      UC  Treatments / Results  Labs (all labs ordered are listed, but only abnormal results are displayed) Labs Reviewed - No data to display  EKG   Radiology No results found.  Procedures Procedures (including critical care time)  Medications Ordered in UC Medications  ondansetron (ZOFRAN-ODT) disintegrating tablet 4 mg (4 mg Oral Given 03/10/24 1111)  ketorolac (TORADOL) 30 MG/ML injection 30 mg (30 mg Intramuscular Given 03/10/24 1156)    Initial Impression / Assessment and Plan / UC Course  I have reviewed the triage vital signs and the nursing notes.  Pertinent labs & imaging results that were available during my care of the patient were reviewed by me and considered in my medical decision making (see chart for details).   1. Tendinitis of right knee, overuse injury of lower leg Knee pain is consistent with overuse tendinopathy secondary to repetitive movements and jumping motions required by his job. Deferred imaging of the knee given atraumatic mechanism of injury/pain and stable musculoskeletal findings. Recommend compression with knee brace to be purchased over the counter (he is self-pay and this will be cheaper).  Ketorolac 30mg  IM given today, may start naproxen 500mg  every 12 hours as needed for knee pain tomorrow. No NSAIDs for 24 hours.  RICE recommended. Walking referral to orthopedics provided for follow-up.   2. Viral gastroenteritis Evaluation suggests viral gastrointestinal illness etiology.  Patient nontoxic appearing with hemodynamically stable vital signs, abdominal exam without peritoneal signs/focal tenderness. Will manage this with antiemetic (Zofran) as needed, OTC medicines as needed for discomfort/pain, increased fluids, and rest. Zofran given in clinic with relief of nausea prior to discharge. Patient able to take sips of fluids without vomiting prior to discharge. Liquid/bland diet initially, then increase diet as tolerated.    Counseled patient on potential for adverse effects with medications prescribed/recommended today, strict ER and return-to-clinic precautions discussed, patient verbalized understanding.    Final Clinical Impressions(s) / UC Diagnoses   Final diagnoses:  Tendinitis of right knee  Overuse injury of lower leg  Viral gastroenteritis     Discharge Instructions      Your evaluation suggests that your symptoms are most likely due to viral stomach illness (gastroenteritis/"stomach bug") which will improve on its own with rest and fluids in the next few days.   Take zofran to help with nausea every 8 hours as needed. You may use over the counter medicines for aches and pains such as tylenol as needed.  Start sipping on liquids (broth, water, gatorade, etc). If you are able to keep liquids down without vomiting for 1-2 hours, you may eat bland foods like jello, pudding, applesauce, bananas, rice, and white toast. Once you can tolerate blands, you may return to normal diet.   Pedialyte or gatorolyte may help to prevent/fix dehydration due to vomiting and diarrhea.  Tendinitis of your right knee. This is inflammation and overuse of the tendons of the right knee. We gave you a shot of ketorolac in the clinic today which is a stronger version of ibuprofen and injection form. Do not take any NSAIDs for the next 24 hours (ibuprofen, naproxen, Aleve). You may take Tylenol as needed. Starting tomorrow morning, take naproxen 500 mg every 12 hours as needed for pain and inflammation of the right knee.  Purchase a knee brace at [facility] that is compressive and wear this daily at work.  Rest until Monday, I have given you a work note for this. Please schedule a follow-up appointment with [facility] for ongoing evaluation and  management of your chronic knee pain.     ED Prescriptions     Medication Sig Dispense Auth. Provider   ondansetron (ZOFRAN-ODT) 4 MG disintegrating tablet  Take 1 tablet (4 mg total) by mouth every 8 (eight) hours as needed for nausea or vomiting. 20 tablet Reita May M, FNP   naproxen (NAPROSYN) 500 MG tablet Take 1 tablet (500 mg total) by mouth 2 (two) times daily. 30 tablet Carlisle Beers, FNP      PDMP not reviewed this encounter.   Carlisle Beers, [place] 03/10/24 1511

## 2024-03-10 NOTE — Discharge Instructions (Addendum)
 Your evaluation suggests that your symptoms are most likely due to viral stomach illness (gastroenteritis/"stomach bug") which will improve on its own with rest and fluids in the next few days.   Take zofran to help with nausea every 8 hours as needed. You may use over the counter medicines for aches and pains such as tylenol as needed.  Start sipping on liquids (broth, water, gatorade, etc). If you are able to keep liquids down without vomiting for 1-2 hours, you may eat bland foods like jello, pudding, applesauce, bananas, rice, and white toast. Once you can tolerate blands, you may return to normal diet.   Pedialyte or gatorolyte may help to prevent/fix dehydration due to vomiting and diarrhea.  Tendinitis of your right knee. This is inflammation and overuse of the tendons of the right knee. We gave you a shot of ketorolac in the clinic today which is a stronger version of ibuprofen and injection form. Do not take any NSAIDs for the next 24 hours (ibuprofen, naproxen, Aleve). You may take Tylenol as needed. Starting tomorrow morning, take naproxen 500 mg every 12 hours as needed for pain and inflammation of the right knee.  Purchase a knee brace at [facility] that is compressive and wear this daily at work.  Rest until Monday, I have given you a work note for this. Please schedule a follow-up appointment with [facility] for ongoing evaluation and management of your chronic knee pain.

## 2024-04-12 ENCOUNTER — Encounter (HOSPITAL_COMMUNITY): Payer: Self-pay

## 2024-04-12 ENCOUNTER — Ambulatory Visit (HOSPITAL_COMMUNITY)
Admission: EM | Admit: 2024-04-12 | Discharge: 2024-04-12 | Disposition: A | Discharge status: Home / Self Care | Attending: Emergency Medicine | Admitting: Emergency Medicine

## 2024-04-12 DIAGNOSIS — R112 Nausea with vomiting, unspecified: Secondary | ICD-10-CM

## 2024-04-12 DIAGNOSIS — K529 Noninfective gastroenteritis and colitis, unspecified: Secondary | ICD-10-CM

## 2024-04-12 DIAGNOSIS — Z9189 Other specified personal risk factors, not elsewhere classified: Secondary | ICD-10-CM | POA: Diagnosis not present

## 2024-04-12 MED ORDER — ONDANSETRON 4 MG PO TBDP
ORAL_TABLET | ORAL | Status: AC
  Filled 2024-04-12: qty 1

## 2024-04-12 MED ORDER — ONDANSETRON 4 MG PO TBDP: 4.0000 mg | ORAL_TABLET | Freq: Three times a day (TID) | ORAL | 0 refills | Status: AC | PRN

## 2024-04-12 MED ORDER — LIDOCAINE VISCOUS HCL 2 % MT SOLN
OROMUCOSAL | Status: AC
  Filled 2024-04-12: qty 15

## 2024-04-12 MED ORDER — ALUM & MAG HYDROXIDE-SIMETH 200-200-20 MG/5ML PO SUSP
30.0000 mL | Freq: Once | ORAL | Status: AC
  Administered 2024-04-12: 30 mL via ORAL

## 2024-04-12 MED ORDER — LIDOCAINE VISCOUS HCL 2 % MT SOLN
15.0000 mL | Freq: Once | OROMUCOSAL | Status: AC
  Administered 2024-04-12: 15 mL via OROMUCOSAL

## 2024-04-12 MED ORDER — DICYCLOMINE HCL 20 MG PO TABS: 20.0000 mg | ORAL_TABLET | Freq: Two times a day (BID) | ORAL | 0 refills | Status: AC

## 2024-04-12 MED ORDER — ALUM & MAG HYDROXIDE-SIMETH 200-200-20 MG/5ML PO SUSP
ORAL | Status: AC
  Filled 2024-04-12: qty 30

## 2024-04-12 MED ORDER — ONDANSETRON 4 MG PO TBDP
4.0000 mg | ORAL_TABLET | Freq: Once | ORAL | Status: AC
  Administered 2024-04-12: 4 mg via ORAL

## 2024-04-12 NOTE — Discharge Instructions (Addendum)
 Take Zofran  every 8 hours as needed for nausea and vomiting. Take Bentyl  twice daily to help with abdominal cramping and discomfort. You can take over-the-counter Imodium as needed for diarrhea. Make sure you are staying hydrated and getting some rest. Slowly work your way back to your normal diet as tolerated. If you develop worsening abdominal pain, excessive vomiting, blood in vomit or stool, fever, or passing out please seek immediate medical treatment in the emergency department.

## 2024-04-12 NOTE — ED Provider Notes (Signed)
 [facility]    CSN: [id number] Arrival date & time: 04/12/24  7829      History   Chief Complaint Chief Complaint  Patient presents with   Nausea   Emesis    HPI Jonathan Williamson is a 33 y.o. male.   Patient presents with nausea, vomiting, diarrhea, and central abdominal pain that began around 5 PM yesterday.  Patient states that he had 2 episodes of vomiting last night, denies any vomiting today but reports continued nausea.  Patient states that he has had a total of 6-7 episodes of diarrhea since his symptoms began.  Patient reports that he did eat some shrimp alfredo from a restaurant around 3 PM yesterday and thinks that this could be related to his symptoms.  Patient states that he has been nervous to eat anything because he feels like he might throw up, but has been able to keep water down.  Patient states that even upon drinking water he begins to have some abdominal discomfort.  Patient states that he did take a dose of Zofran  last night with minimal relief.  Denies blood in stool, constipation, fever, weakness, lightheadedness, dizziness, and passing out.  Denies any known sick exposures.   The history is provided by the patient and medical records.  Emesis   History reviewed. No pertinent past medical history.  There are no active problems to display for this patient.   History reviewed. No pertinent surgical history.     Home Medications    Prior to Admission medications   Medication Sig Start Date End Date Taking? Authorizing Provider  dicyclomine  (BENTYL ) 20 MG tablet Take 1 tablet (20 mg total) by mouth 2 (two) times daily. 04/12/24  Yes Rosevelt Constable, Aiyanah Kalama A, NP  ondansetron  (ZOFRAN -ODT) 4 MG disintegrating tablet Take 1 tablet (4 mg total) by mouth every 8 (eight) hours as needed for nausea or vomiting. 04/12/24  Yes Rosevelt Constable, Rhett Najera A, NP  naproxen  (NAPROSYN ) 500 MG tablet Take 1 tablet (500 mg total) by mouth 2 (two) times daily. 03/10/24   Starlene Eaton, FNP    Family History Family History  Problem Relation Age of Onset   Healthy Mother    Healthy Father     Social History Social History   Tobacco Use   Smoking status: Some Days    Types: Cigarettes   Tobacco comments:    pt vvapes  Vaping Use   Vaping status: Some Days  Substance Use Topics   Alcohol use: Yes    Comment: occasion   Drug use: No     Allergies   Patient has no known allergies.   Review of Systems Review of Systems  Gastrointestinal:  Positive for vomiting.   Per HPI  Physical Exam Triage Vital Signs ED Triage Vitals  Encounter Vitals Group     BP 04/12/24 1008 (!) 164/93     Systolic BP Percentile --      Diastolic BP Percentile --      Pulse Rate 04/12/24 1008 73     Resp 04/12/24 1008 18     Temp 04/12/24 1008 98.4 F (36.9 C)     Temp Source 04/12/24 1008 Oral     SpO2 04/12/24 1008 98 %     Weight --      Height --      Head Circumference --      Peak Flow --      Pain Score 04/12/24 1009 6     Pain  Loc --      Pain Education --      Exclude from Growth Chart --    No data found.  Updated Vital Signs BP (!) 164/93 (BP Location: Left Arm)   Pulse 73   Temp 98.4 F (36.9 C) (Oral)   Resp 18   SpO2 98%   Visual Acuity Right Eye Distance:   Left Eye Distance:   Bilateral Distance:    Right Eye Near:   Left Eye Near:    Bilateral Near:     Physical Exam Vitals and nursing note reviewed.  Constitutional:      General: He is awake. He is not in acute distress.    Appearance: Normal appearance. He is well-developed and well-groomed. He is not ill-appearing.  Abdominal:     General: Abdomen is flat. Bowel sounds are normal. There is no distension.     Palpations: Abdomen is soft.     Tenderness: There is abdominal tenderness in the epigastric area and periumbilical area. There is no guarding or rebound. Negative signs include Murphy's sign.     Hernia: No hernia is present.  Skin:    General: Skin is  warm and dry.  Neurological:     Mental Status: He is alert.  Psychiatric:        Behavior: Behavior is cooperative.      UC Treatments / Results  Labs (all labs ordered are listed, but only abnormal results are displayed) Labs Reviewed - No data to display  EKG   Radiology No results found.  Procedures Procedures (including critical care time)  Medications Ordered in UC Medications  ondansetron  (ZOFRAN -ODT) disintegrating tablet 4 mg (4 mg Oral Given 04/12/24 1047)  lidocaine  (XYLOCAINE ) 2 % viscous mouth solution 15 mL (15 mLs Mouth/Throat Given 04/12/24 1047)  alum & mag hydroxide-simeth (MAALOX/MYLANTA) 200-200-20 MG/5ML suspension 30 mL (30 mLs Oral Given 04/12/24 1047)    Initial Impression / Assessment and Plan / UC Course  I have reviewed the triage vital signs and the nursing notes.  Pertinent labs & imaging results that were available during my care of the patient were reviewed by me and considered in my medical decision making (see chart for details).     Patient is well-appearing.  Vitals are stable.  Patient mildly hypertensive at 164/93.  Upon assessment tenderness noted to epigastric region, and mild tenderness noted to periumbilical region.  Without guarding or rebound tenderness.  Negative Murphy's sign.  Patient given Zofran  and GI cocktail without relief.  Patient states that his symptoms feel a little worse and patient is now slightly writhing around.  Upon asking patient how he is feeling he keeps repeating that he feels like his stomach is turning with no worsening pain. Given the patient little more time to see if the medications help.    Upon reassessment patient states that the nausea and epigastric pain have subsided, patient states that he continues to have some mild abdominal cramping and states his stomach feels like it is turning.  Patient has not had any vomiting or diarrhea while in clinic.  Prescribed Zofran  for nausea.  Prescribed Bentyl  for  abdominal cramping.  Recommended Imodium as needed for diarrhea.  Discussed importance of hydration.  Discussed return and strict ER precautions. Final Clinical Impressions(s) / UC Diagnoses   Final diagnoses:  Nausea vomiting and diarrhea  Gastroenteritis  Potential for food poisoning     Discharge Instructions      Take Zofran  every 8 hours as  needed for nausea and vomiting. Take Bentyl  twice daily to help with abdominal cramping and discomfort. You can take over-the-counter Imodium as needed for diarrhea. Make sure you are staying hydrated and getting some rest. Slowly work your way back to your normal diet as tolerated. If you develop worsening abdominal pain, excessive vomiting, blood in vomit or stool, fever, or passing out please seek immediate medical treatment in the emergency department.     ED Prescriptions     Medication Sig Dispense Auth. Provider   ondansetron  (ZOFRAN -ODT) 4 MG disintegrating tablet Take 1 tablet (4 mg total) by mouth every 8 (eight) hours as needed for nausea or vomiting. 10 tablet Levora Reas A, NP   dicyclomine  (BENTYL ) 20 MG tablet Take 1 tablet (20 mg total) by mouth 2 (two) times daily. 20 tablet Levora Reas A, NP      PDMP not reviewed this encounter.   Levora Reas A, NP 04/12/24 1151

## 2024-04-12 NOTE — ED Notes (Signed)
Iced water given to patient.

## 2024-04-12 NOTE — ED Triage Notes (Signed)
 Pt c/o n/v/d with center abdominal pain since yesterday. Diarrhea x2 today. States unable to keep down anything down except water.
